# Patient Record
Sex: Male | Born: 1960 | Race: Black or African American | Hispanic: No | Marital: Married | Smoking: Never smoker
Health system: Southern US, Community
[De-identification: ages and names within clinical notes are randomized; demographics above are authoritative.]

## PROBLEM LIST (undated history)

## (undated) DIAGNOSIS — N469 Male infertility, unspecified: Secondary | ICD-10-CM

## (undated) DIAGNOSIS — R9431 Abnormal electrocardiogram [ECG] [EKG]: Secondary | ICD-10-CM

## (undated) DIAGNOSIS — D497 Neoplasm of unspecified behavior of endocrine glands and other parts of nervous system: Secondary | ICD-10-CM

## (undated) DIAGNOSIS — M79646 Pain in unspecified finger(s): Secondary | ICD-10-CM

## (undated) DIAGNOSIS — R079 Chest pain, unspecified: Secondary | ICD-10-CM

## (undated) DIAGNOSIS — G4730 Sleep apnea, unspecified: Secondary | ICD-10-CM

## (undated) DIAGNOSIS — L709 Acne, unspecified: Secondary | ICD-10-CM

## (undated) DIAGNOSIS — K760 Fatty (change of) liver, not elsewhere classified: Secondary | ICD-10-CM

## (undated) DIAGNOSIS — N62 Hypertrophy of breast: Secondary | ICD-10-CM

## (undated) DIAGNOSIS — E221 Hyperprolactinemia: Secondary | ICD-10-CM

## (undated) DIAGNOSIS — N400 Enlarged prostate without lower urinary tract symptoms: Secondary | ICD-10-CM

## (undated) HISTORY — DX: Abnormal electrocardiogram (ECG) (EKG): R94.31

## (undated) HISTORY — PX: ACHILLES TENDON REPAIR: SUR1153

## (undated) HISTORY — DX: Chest pain, unspecified: R07.9

## (undated) HISTORY — DX: Benign prostatic hyperplasia without lower urinary tract symptoms: N40.0

## (undated) HISTORY — DX: Pain in unspecified finger(s): M79.646

## (undated) HISTORY — DX: Hypertrophy of breast: N62

## (undated) HISTORY — DX: Male infertility, unspecified: N46.9

## (undated) HISTORY — DX: Fatty (change of) liver, not elsewhere classified: K76.0

## (undated) HISTORY — DX: Acne, unspecified: L70.9

## (undated) HISTORY — DX: Hyperprolactinemia: E22.1

## (undated) HISTORY — DX: Neoplasm of unspecified behavior of endocrine glands and other parts of nervous system: D49.7

## (undated) HISTORY — DX: Morbid (severe) obesity due to excess calories: E66.01

---

## 1997-07-11 ENCOUNTER — Encounter: Admission: RE | Admit: 1997-07-11 | Discharge: 1997-10-09 | Payer: Self-pay | Admitting: Radiation Oncology

## 1998-07-19 ENCOUNTER — Encounter: Payer: Self-pay | Admitting: Emergency Medicine

## 1998-07-19 ENCOUNTER — Emergency Department (HOSPITAL_COMMUNITY): Admission: EM | Admit: 1998-07-19 | Discharge: 1998-07-19 | Payer: Self-pay | Admitting: Emergency Medicine

## 2001-06-23 ENCOUNTER — Encounter: Payer: Self-pay | Admitting: Internal Medicine

## 2001-06-23 ENCOUNTER — Ambulatory Visit (HOSPITAL_COMMUNITY): Admission: RE | Admit: 2001-06-23 | Discharge: 2001-06-23 | Payer: Self-pay | Admitting: Internal Medicine

## 2002-11-03 ENCOUNTER — Encounter: Payer: Self-pay | Admitting: Internal Medicine

## 2002-11-03 ENCOUNTER — Ambulatory Visit (HOSPITAL_COMMUNITY): Admission: RE | Admit: 2002-11-03 | Discharge: 2002-11-03 | Payer: Self-pay | Admitting: Internal Medicine

## 2011-02-11 ENCOUNTER — Other Ambulatory Visit: Payer: Self-pay | Admitting: Internal Medicine

## 2011-02-11 DIAGNOSIS — D353 Benign neoplasm of craniopharyngeal duct: Secondary | ICD-10-CM

## 2011-02-15 ENCOUNTER — Ambulatory Visit
Admission: RE | Admit: 2011-02-15 | Discharge: 2011-02-15 | Disposition: A | Discharge status: Home / Self Care | Payer: PRIVATE HEALTH INSURANCE | Source: Ambulatory Visit | Attending: Internal Medicine | Admitting: Internal Medicine

## 2011-02-15 DIAGNOSIS — D352 Benign neoplasm of pituitary gland: Secondary | ICD-10-CM

## 2011-02-15 MED ORDER — GADOBENATE DIMEGLUMINE 529 MG/ML IV SOLN
10.0000 mL | Freq: Once | INTRAVENOUS | Status: AC | PRN
  Administered 2011-02-15: 10 mL via INTRAVENOUS

## 2014-03-11 ENCOUNTER — Ambulatory Visit
Admission: RE | Admit: 2014-03-11 | Discharge: 2014-03-11 | Disposition: A | Discharge status: Home / Self Care | Payer: 59 | Source: Ambulatory Visit | Attending: Nurse Practitioner | Admitting: Nurse Practitioner

## 2014-03-11 ENCOUNTER — Other Ambulatory Visit: Payer: Self-pay | Admitting: Nurse Practitioner

## 2014-03-11 DIAGNOSIS — J069 Acute upper respiratory infection, unspecified: Secondary | ICD-10-CM

## 2014-11-16 ENCOUNTER — Emergency Department (HOSPITAL_COMMUNITY)
Admission: EM | Admit: 2014-11-16 | Discharge: 2014-11-17 | Disposition: A | Discharge status: Home / Self Care | Payer: 59 | Attending: Emergency Medicine | Admitting: Emergency Medicine

## 2014-11-16 ENCOUNTER — Encounter (HOSPITAL_COMMUNITY): Payer: Self-pay

## 2014-11-16 DIAGNOSIS — R1011 Right upper quadrant pain: Secondary | ICD-10-CM | POA: Diagnosis present

## 2014-11-16 DIAGNOSIS — K529 Noninfective gastroenteritis and colitis, unspecified: Secondary | ICD-10-CM | POA: Diagnosis not present

## 2014-11-16 DIAGNOSIS — Z86018 Personal history of other benign neoplasm: Secondary | ICD-10-CM | POA: Insufficient documentation

## 2014-11-16 HISTORY — DX: Neoplasm of unspecified behavior of endocrine glands and other parts of nervous system: D49.7

## 2014-11-16 LAB — CBC
HCT: 40.4 % (ref 39.0–52.0)
Hemoglobin: 13.5 g/dL (ref 13.0–17.0)
MCH: 29 pg (ref 26.0–34.0)
MCHC: 33.4 g/dL (ref 30.0–36.0)
MCV: 86.7 fL (ref 78.0–100.0)
PLATELETS: 236 10*3/uL (ref 150–400)
RBC: 4.66 MIL/uL (ref 4.22–5.81)
RDW: 12.7 % (ref 11.5–15.5)
WBC: 7.3 10*3/uL (ref 4.0–10.5)

## 2014-11-16 LAB — URINALYSIS, ROUTINE W REFLEX MICROSCOPIC
Bilirubin Urine: NEGATIVE
Glucose, UA: NEGATIVE mg/dL
Hgb urine dipstick: NEGATIVE
Ketones, ur: NEGATIVE mg/dL
Leukocytes, UA: NEGATIVE
Nitrite: NEGATIVE
Protein, ur: NEGATIVE mg/dL
Specific Gravity, Urine: 1.018 (ref 1.005–1.030)
Urobilinogen, UA: 0.2 mg/dL (ref 0.0–1.0)
pH: 6 (ref 5.0–8.0)

## 2014-11-16 LAB — COMPREHENSIVE METABOLIC PANEL
ALT: 21 U/L (ref 17–63)
AST: 29 U/L (ref 15–41)
Albumin: 3.8 g/dL (ref 3.5–5.0)
Alkaline Phosphatase: 51 U/L (ref 38–126)
Anion gap: 7 (ref 5–15)
BILIRUBIN TOTAL: 0.5 mg/dL (ref 0.3–1.2)
BUN: 8 mg/dL (ref 6–20)
CO2: 29 mmol/L (ref 22–32)
Calcium: 9.5 mg/dL (ref 8.9–10.3)
Chloride: 101 mmol/L (ref 101–111)
Creatinine, Ser: 1.05 mg/dL (ref 0.61–1.24)
GFR calc Af Amer: >60 mL/min (ref >60)
Glucose, Bld: 113 mg/dL — ABNORMAL HIGH (ref 65–99)
Potassium: 4.2 mmol/L (ref 3.5–5.1)
Sodium: 137 mmol/L (ref 135–145)
Total Protein: 7.5 g/dL (ref 6.5–8.1)

## 2014-11-16 LAB — LIPASE, BLOOD: LIPASE: 12 U/L — AB (ref 22–51)

## 2014-11-16 NOTE — ED Notes (Signed)
Pt reports onset yesterday ate piece of chicken from convenience store.  Wife immediately had diarrhea.  Pt started having abd pain several hours after eating chicken.  Diarrhea started @ 12noon x 3, watery stools.  Pt drank 1/2 bottle of castor oil this morning @ 9am for abd pain.  Pt still having constant abd pain.

## 2014-11-17 ENCOUNTER — Emergency Department (HOSPITAL_COMMUNITY): Payer: 59

## 2014-11-17 MED ORDER — SODIUM CHLORIDE 0.9 % IV BOLUS (SEPSIS)
1000.0000 mL | Freq: Once | INTRAVENOUS | Status: AC
  Administered 2014-11-17: 1000 mL via INTRAVENOUS

## 2014-11-17 MED ORDER — ONDANSETRON HCL 4 MG/2ML IJ SOLN
4.0000 mg | Freq: Once | INTRAMUSCULAR | Status: AC
  Administered 2014-11-17: 4 mg via INTRAVENOUS
  Filled 2014-11-17: qty 2

## 2014-11-17 MED ORDER — HYDROCODONE-ACETAMINOPHEN 5-325 MG PO TABS: 1.0000 | ORAL_TABLET | Freq: Four times a day (QID) | ORAL | Status: DC | PRN

## 2014-11-17 MED ORDER — KETOROLAC TROMETHAMINE 30 MG/ML IJ SOLN
30.0000 mg | Freq: Once | INTRAMUSCULAR | Status: AC
  Administered 2014-11-17: 30 mg via INTRAVENOUS
  Filled 2014-11-17: qty 1

## 2014-11-17 NOTE — ED Provider Notes (Signed)
CSN: [id number]     Arrival date & time 11/16/14  2237 History  This chart was scribed for Veryl Speak, MD by Randa Evens, ED Scribe. This patient was seen in room A10C/A10C and the patient's care was started at 12:03 AM.      Chief Complaint  Patient presents with  . Abdominal Pain   Patient is a 54 y.o. male presenting with abdominal pain. The history is provided by the patient. No language interpreter was used.  Abdominal Pain Pain radiates to:  Does not radiate Pain severity:  Moderate Duration:  2 days Timing:  Constant Progression:  Unchanged Chronicity:  New Context: suspicious food intake   Relieved by:  Nothing Associated symptoms: diarrhea   Associated symptoms: no nausea and no vomiting   Risk factors: has not had multiple surgeries    HPI Comments: Wesley Little is a 54 y.o. male who presents to the Emergency Department complaining of constant upper abdominal pain onset 1 day prior. Pt reports eating some chicken yesterday at 4 PM that didn't agree with his stomach. Pt reports diarrhea. Pt states that his wife ate the same thing and she has had diarrhea as well. Pt states that that he has tried drinking castor oil with no relief. Pt denies nausea or vomiting.   Past Medical History  Diagnosis Date  . Pituitary tumor    Past Surgical History  Procedure Laterality Date  . Achilles tendon repair     History reviewed. No pertinent family history. History  Substance Use Topics  . Smoking status: Never Smoker   . Smokeless tobacco: Not on file  . Alcohol Use: No    Review of Systems  Gastrointestinal: Positive for abdominal pain and diarrhea. Negative for nausea and vomiting.  All other systems reviewed and are negative.    Allergies  Review of patient's allergies indicates no known allergies.  Home Medications   Prior to Admission medications   Not on File   BP 140/83 mmHg  Pulse 56  Temp(Src) 97.8 F (36.6 C) (Oral)  Resp 16  Ht 5\' 8"  (1.727 m)   Wt 315 lb (142.883 kg)  BMI 47.91 kg/m2  SpO2 100%   Physical Exam  Constitutional: He is oriented to person, place, and time. He appears well-developed and well-nourished. No distress.  HENT:  Head: Normocephalic and atraumatic.  Eyes: Conjunctivae and EOM are normal.  Neck: Neck supple. No tracheal deviation present.  Cardiovascular: Normal rate.   Pulmonary/Chest: Effort normal. No respiratory distress.  Abdominal: There is tenderness. There is no rebound and no guarding.  TTP in RUQ and epigastric region.   Musculoskeletal: Normal range of motion.  Neurological: He is alert and oriented to person, place, and time.  Skin: Skin is warm and dry.  Psychiatric: He has a normal mood and affect. His behavior is normal.  Nursing note and vitals reviewed.   ED Course  Procedures (including critical care time) DIAGNOSTIC STUDIES: Oxygen Saturation is 92% on RA, adequate by my interpretation.    COORDINATION OF CARE: 12:16 AM-Discussed treatment plan with pt at bedside and pt agreed to plan.     Labs Review Labs Reviewed  LIPASE, BLOOD - Abnormal; Notable for the following:    Lipase 12 (*)    All other components within normal limits  COMPREHENSIVE METABOLIC PANEL - Abnormal; Notable for the following:    Glucose, Bld 113 (*)    All other components within normal limits  CBC  URINALYSIS, ROUTINE W REFLEX  MICROSCOPIC (NOT AT [facility])    Imaging Review US Abdomen Limited Ruq  11/17/2014   CLINICAL DATA:  Acute onset of right upper quadrant abdominal pain for 48 hours. Initial encounter.  EXAM: US ABDOMEN LIMITED - RIGHT UPPER QUADRANT  COMPARISON:  None.  FINDINGS: Gallbladder:  There is suggestion of a few small stones dependently within the gallbladder. The gallbladder wall is borderline normal in thickness. No significant pericholecystic fluid is seen. Slight tenderness is noted overlying the gallbladder, though no definite ultrasonographic Murphy's sign is elicited. This is  equivocal as the patient is on pain medication.  Common bile duct:  Diameter: 0.4 cm, within normal limits in caliber.  Liver:  No focal lesion identified. Within normal limits in parenchymal echogenicity.  IMPRESSION: Suggestion of mild cholelithiasis. Slight tenderness overlying the gallbladder, but the gallbladder is otherwise grossly unremarkable. No definite evidence for obstruction or cholecystitis. Evaluation for ultrasonographic Murphy's sign is limited as the patient is on pain medication.   Electronically Signed   By: Garald Balding M.D.   On: 11/17/2014 02:04     EKG Interpretation None      MDM   Final diagnoses:  Abdominal pain, RUQ      Patient presents with abdominal pain and diarrhea that started shortly after eating a piece of chicken at a convenience store yesterday. He reports upper abdominal cramping. His diarrhea has been nonbloody. His workup today reveals unremarkable laboratory studies and ultrasound which does not show evidence for acute cholecystitis or other emergent pathology. He is feeling better after fluids and medications in the ER and I believe is appropriate for discharge.   I personally performed the services described in this documentation, which was scribed in my presence. The recorded information has been reviewed and is accurate.      Veryl Speak, MD 11/17/14 [phone]

## 2014-11-17 NOTE — Discharge Instructions (Signed)
Clear liquid diet for the next several days.  Hydrocodone as prescribed as needed for pain.  Return to the emergency department if you develop worsening pain, high fever, bloody stool, or other new and concerning symptoms.   Abdominal Pain Many things can cause abdominal pain. Usually, abdominal pain is not caused by a disease and will improve without treatment. It can often be observed and treated at home. Your health care provider will do a physical exam and possibly order blood tests and X-rays to help determine the seriousness of your pain. However, in many cases, more time must pass before a clear cause of the pain can be found. Before that point, your health care provider may not know if you need more testing or further treatment. HOME CARE INSTRUCTIONS  Monitor your abdominal pain for any changes. The following actions may help to alleviate any discomfort you are experiencing:  Only take over-the-counter or prescription medicines as directed by your health care provider.  Do not take laxatives unless directed to do so by your health care provider.  Try a clear liquid diet (broth, tea, or water) as directed by your health care provider. Slowly move to a bland diet as tolerated. SEEK MEDICAL CARE IF:  You have unexplained abdominal pain.  You have abdominal pain associated with nausea or diarrhea.  You have pain when you urinate or have a bowel movement.  You experience abdominal pain that wakes you in the night.  You have abdominal pain that is worsened or improved by eating food.  You have abdominal pain that is worsened with eating fatty foods.  You have a fever. SEEK IMMEDIATE MEDICAL CARE IF:   Your pain does not go away within 2 hours.  You keep throwing up (vomiting).  Your pain is felt only in portions of the abdomen, such as the right side or the left lower portion of the abdomen.  You pass bloody or black tarry stools. MAKE SURE YOU:  Understand these  instructions.   Will watch your condition.   Will get help right away if you are not doing well or get worse.  Document Released: 01/05/2005 Document Revised: 04/02/2013 Document Reviewed: 12/05/2012 [facility] Patient Information 2015 [place], [place]. This information is not intended to replace advice given to you by your health care provider. Make sure you discuss any questions you have with your health care provider.  Viral Gastroenteritis Viral gastroenteritis is also known as stomach flu. This condition affects the stomach and intestinal tract. It can cause sudden diarrhea and vomiting. The illness typically lasts 3 to 8 days. Most people develop an immune response that eventually gets rid of the virus. While this natural response develops, the virus can make you quite ill. CAUSES  Many different viruses can cause gastroenteritis, such as rotavirus or noroviruses. You can catch one of these viruses by consuming contaminated food or water. You may also catch a virus by sharing utensils or other personal items with an infected person or by touching a contaminated surface. SYMPTOMS  The most common symptoms are diarrhea and vomiting. These problems can cause a severe loss of body fluids (dehydration) and a body salt (electrolyte) imbalance. Other symptoms may include:  Fever.  Headache.  Fatigue.  Abdominal pain. DIAGNOSIS  Your caregiver can usually diagnose viral gastroenteritis based on your symptoms and a physical exam. A stool sample may also be taken to test for the presence of viruses or other infections. TREATMENT  This illness typically goes away on its  own. Treatments are aimed at rehydration. The most serious cases of viral gastroenteritis involve vomiting so severely that you are not able to keep fluids down. In these cases, fluids must be given through an intravenous line (IV). HOME CARE INSTRUCTIONS   Drink enough fluids to keep your urine clear or pale yellow. Drink  small amounts of fluids frequently and increase the amounts as tolerated.  Ask your caregiver for specific rehydration instructions.  Avoid:  Foods high in sugar.  Alcohol.  Carbonated drinks.  Tobacco.  Juice.  Caffeine drinks.  Extremely hot or cold fluids.  Fatty, greasy foods.  Too much intake of anything at one time.  Dairy products until 24 to 48 hours after diarrhea stops.  You may consume probiotics. Probiotics are active cultures of beneficial bacteria. They may lessen the amount and number of diarrheal stools in adults. Probiotics can be found in yogurt with active cultures and in supplements.  Wash your hands well to avoid spreading the virus.  Only take over-the-counter or prescription medicines for pain, discomfort, or fever as directed by your caregiver. Do not give aspirin to children. Antidiarrheal medicines are not recommended.  Ask your caregiver if you should continue to take your regular prescribed and over-the-counter medicines.  Keep all follow-up appointments as directed by your caregiver. SEEK IMMEDIATE MEDICAL CARE IF:   You are unable to keep fluids down.  You do not urinate at least once every 6 to 8 hours.  You develop shortness of breath.  You notice blood in your stool or vomit. This may look like coffee grounds.  You have abdominal pain that increases or is concentrated in one small area (localized).  You have persistent vomiting or diarrhea.  You have a fever.  The patient is a child younger than 3 months, and he or she has a fever.  The patient is a child older than 3 months, and he or she has a fever and persistent symptoms.  The patient is a child older than 3 months, and he or she has a fever and symptoms suddenly get worse.  The patient is a baby, and he or she has no tears when crying. MAKE SURE YOU:   Understand these instructions.  Will watch your condition.  Will get help right away if you are not doing well or  get worse. Document Released: 03/28/2005 Document Revised: 06/20/2011 Document Reviewed: 01/12/2011 [facility] Patient Information 2015 [place], [place]. This information is not intended to replace advice given to you by your health care provider. Make sure you discuss any questions you have with your health care provider.

## 2014-11-19 ENCOUNTER — Emergency Department (HOSPITAL_COMMUNITY): Payer: 59

## 2014-11-19 ENCOUNTER — Encounter (HOSPITAL_COMMUNITY): Payer: Self-pay | Admitting: *Deleted

## 2014-11-19 ENCOUNTER — Inpatient Hospital Stay (HOSPITAL_COMMUNITY)
Admission: EM | Admit: 2014-11-19 | Discharge: 2014-11-23 | DRG: 418 | Disposition: A | Discharge status: Home / Self Care | Payer: 59 | Attending: Surgery | Admitting: Surgery

## 2014-11-19 DIAGNOSIS — Z79899 Other long term (current) drug therapy: Secondary | ICD-10-CM

## 2014-11-19 DIAGNOSIS — K660 Peritoneal adhesions (postprocedural) (postinfection): Secondary | ICD-10-CM | POA: Diagnosis present

## 2014-11-19 DIAGNOSIS — Z6841 Body Mass Index (BMI) 40.0 and over, adult: Secondary | ICD-10-CM

## 2014-11-19 DIAGNOSIS — K810 Acute cholecystitis: Secondary | ICD-10-CM | POA: Diagnosis not present

## 2014-11-19 DIAGNOSIS — R109 Unspecified abdominal pain: Secondary | ICD-10-CM | POA: Diagnosis not present

## 2014-11-19 DIAGNOSIS — Z419 Encounter for procedure for purposes other than remedying health state, unspecified: Secondary | ICD-10-CM

## 2014-11-19 HISTORY — DX: Sleep apnea, unspecified: G47.30

## 2014-11-19 LAB — URINALYSIS, ROUTINE W REFLEX MICROSCOPIC
BILIRUBIN URINE: NEGATIVE
Glucose, UA: NEGATIVE mg/dL
HGB URINE DIPSTICK: NEGATIVE
KETONES UR: NEGATIVE mg/dL
Leukocytes, UA: NEGATIVE
Nitrite: NEGATIVE
PH: 6.5 (ref 5.0–8.0)
PROTEIN: NEGATIVE mg/dL
Specific Gravity, Urine: 1.042 — ABNORMAL HIGH (ref 1.005–1.030)
UROBILINOGEN UA: 1 mg/dL (ref 0.0–1.0)

## 2014-11-19 LAB — COMPREHENSIVE METABOLIC PANEL
ALBUMIN: 3.5 g/dL (ref 3.5–5.0)
ALT: 33 U/L (ref 17–63)
AST: 47 U/L — AB (ref 15–41)
Alkaline Phosphatase: 63 U/L (ref 38–126)
Anion gap: 9 (ref 5–15)
BUN: 5 mg/dL — ABNORMAL LOW (ref 6–20)
CO2: 29 mmol/L (ref 22–32)
CREATININE: 0.98 mg/dL (ref 0.61–1.24)
Calcium: 9.1 mg/dL (ref 8.9–10.3)
Chloride: 99 mmol/L — ABNORMAL LOW (ref 101–111)
GFR calc Af Amer: >60 mL/min (ref >60)
Glucose, Bld: 105 mg/dL — ABNORMAL HIGH (ref 65–99)
Potassium: 3.9 mmol/L (ref 3.5–5.1)
Sodium: 137 mmol/L (ref 135–145)
Total Bilirubin: 1 mg/dL (ref 0.3–1.2)
Total Protein: 7.8 g/dL (ref 6.5–8.1)

## 2014-11-19 LAB — CBC
HEMATOCRIT: 41.1 % (ref 39.0–52.0)
Hemoglobin: 13.8 g/dL (ref 13.0–17.0)
MCH: 28.9 pg (ref 26.0–34.0)
MCHC: 33.6 g/dL (ref 30.0–36.0)
MCV: 86 fL (ref 78.0–100.0)
Platelets: 234 10*3/uL (ref 150–400)
RBC: 4.78 MIL/uL (ref 4.22–5.81)
RDW: 12.6 % (ref 11.5–15.5)
WBC: 8.9 10*3/uL (ref 4.0–10.5)

## 2014-11-19 LAB — LIPASE, BLOOD: Lipase: 15 U/L — ABNORMAL LOW (ref 22–51)

## 2014-11-19 MED ORDER — ENOXAPARIN SODIUM 40 MG/0.4ML ~~LOC~~ SOLN: 40.0000 mg | SUBCUTANEOUS | Status: DC

## 2014-11-19 MED ORDER — KCL IN DEXTROSE-NACL 20-5-0.45 MEQ/L-%-% IV SOLN
INTRAVENOUS | Status: DC
  Administered 2014-11-20 – 2014-11-21 (×4): via INTRAVENOUS
  Filled 2014-11-19 (×5): qty 1000

## 2014-11-19 MED ORDER — CABERGOLINE 0.5 MG PO TABS
0.2500 mg | ORAL_TABLET | ORAL | Status: DC
  Filled 2014-11-19: qty 1

## 2014-11-19 MED ORDER — HYDROMORPHONE HCL 1 MG/ML IJ SOLN
1.0000 mg | INTRAMUSCULAR | Status: DC | PRN
  Administered 2014-11-20 (×2): 1 mg via INTRAVENOUS
  Administered 2014-11-20: 2 mg via INTRAVENOUS
  Administered 2014-11-21 (×2): 1 mg via INTRAVENOUS
  Filled 2014-11-19 (×4): qty 1
  Filled 2014-11-19: qty 2

## 2014-11-19 MED ORDER — ACETAMINOPHEN 325 MG PO TABS: 650.0000 mg | ORAL_TABLET | Freq: Four times a day (QID) | ORAL | Status: DC | PRN

## 2014-11-19 MED ORDER — ONDANSETRON HCL 4 MG/2ML IJ SOLN
4.0000 mg | Freq: Four times a day (QID) | INTRAMUSCULAR | Status: DC | PRN
  Administered 2014-11-20: 4 mg via INTRAVENOUS
  Administered 2014-11-20: 8 mg via INTRAVENOUS

## 2014-11-19 MED ORDER — PIPERACILLIN-TAZOBACTAM 3.375 G IVPB 30 MIN
3.3750 g | Freq: Once | INTRAVENOUS | Status: AC
  Administered 2014-11-20: 3.375 g via INTRAVENOUS
  Filled 2014-11-19: qty 50

## 2014-11-19 MED ORDER — MORPHINE SULFATE 4 MG/ML IJ SOLN
8.0000 mg | Freq: Once | INTRAMUSCULAR | Status: AC
  Administered 2014-11-19: 8 mg via INTRAVENOUS
  Filled 2014-11-19: qty 2

## 2014-11-19 MED ORDER — ACETAMINOPHEN 650 MG RE SUPP: 650.0000 mg | Freq: Four times a day (QID) | RECTAL | Status: DC | PRN

## 2014-11-19 MED ORDER — ONDANSETRON 4 MG PO TBDP
4.0000 mg | ORAL_TABLET | Freq: Four times a day (QID) | ORAL | Status: DC | PRN
  Filled 2014-11-19: qty 1

## 2014-11-19 MED ORDER — PANTOPRAZOLE SODIUM 40 MG IV SOLR
40.0000 mg | Freq: Every day | INTRAVENOUS | Status: DC
  Administered 2014-11-20 – 2014-11-22 (×4): 40 mg via INTRAVENOUS
  Filled 2014-11-19 (×4): qty 40

## 2014-11-19 NOTE — H&P (Signed)
Wesley Little is an 54 y.o. male.   Chief Complaint: Abdominal pain HPI: Started several days ago, had [place] two days ago suggesting gallstones, no Murphy's sign or acute findings.  Today, examination changed.  Acute GB on ultrasound today.  No other studies necessary  Past Medical History  Diagnosis Date  . Pituitary tumor     Past Surgical History  Procedure Laterality Date  . Achilles tendon repair      No family history on file. Social History:  reports that he has never smoked. He does not have any smokeless tobacco history on file. He reports that he does not drink alcohol or use illicit drugs.  Allergies: No Known Allergies  Medications Prior to Admission  Medication Sig Dispense Refill  . acetaminophen (TYLENOL) 500 MG tablet Take 1,000 mg by mouth every 6 (six) hours as needed for mild pain or moderate pain.    . cabergoline (DOSTINEX) 0.5 MG tablet Take 0.25 mg by mouth 2 (two) times a week.     . calcium carbonate (TUMS - DOSED IN MG ELEMENTAL CALCIUM) 500 MG chewable tablet Chew 1 tablet by mouth daily as needed for indigestion or heartburn.    . Multiple Vitamins-Minerals (MULTIVITAMIN & MINERAL PO) Take 1 tablet by mouth daily.    . peppermint oil liquid Apply 1 application topically as needed (body pain).    . Simethicone (GAS-X PO) Take 1 tablet by mouth daily as needed (flatuelence).    Marland Kitchen HYDROcodone-acetaminophen (NORCO) 5-325 MG per tablet Take 1-2 tablets by mouth every 6 (six) hours as needed. (Patient not taking: Reported on 11/19/2014) 6 tablet 0    Results for orders placed or performed during the hospital encounter of 11/19/14 (from the past 48 hour(s))  Lipase, blood     Status: Abnormal   Collection Time: 11/19/14  3:17 PM  Result Value Ref Range   Lipase 15 (L) 22 - 51 U/L  Comprehensive metabolic panel     Status: Abnormal   Collection Time: 11/19/14  3:17 PM  Result Value Ref Range   Sodium 137 135 - 145 mmol/L   Potassium 3.9 3.5 - 5.1 mmol/L   Chloride 99 (L) 101 - 111 mmol/L   CO2 29 22 - 32 mmol/L   Glucose, Bld 105 (H) 65 - 99 mg/dL   BUN 5 (L) 6 - 20 mg/dL   Creatinine, Ser 4.26 0.61 - 1.24 mg/dL   Calcium 9.1 8.9 - 64.0 mg/dL   Total Protein 7.8 6.5 - 8.1 g/dL   Albumin 3.5 3.5 - 5.0 g/dL   AST 47 (H) 15 - 41 U/L   ALT 33 17 - 63 U/L   Alkaline Phosphatase 63 38 - 126 U/L   Total Bilirubin 1.0 0.3 - 1.2 mg/dL   GFR calc non Af Amer >60 >60 mL/min   GFR calc Af Amer >60 >60 mL/min    Comment: (NOTE) The eGFR has been calculated using the CKD EPI equation. This calculation has not been validated in all clinical situations. eGFR's persistently <60 mL/min signify possible Chronic Kidney Disease.    Anion gap 9 5 - 15  CBC     Status: None   Collection Time: 11/19/14  3:17 PM  Result Value Ref Range   WBC 8.9 4.0 - 10.5 K/uL   RBC 4.78 4.22 - 5.81 MIL/uL   Hemoglobin 13.8 13.0 - 17.0 g/dL   HCT 59.9 54.2 - 82.2 %   MCV 86.0 78.0 - 100.0 fL  MCH 28.9 26.0 - 34.0 pg   MCHC 33.6 30.0 - 36.0 g/dL   RDW 12.6 11.5 - 15.5 %   Platelets 234 150 - 400 K/uL  Urinalysis, Routine w reflex microscopic (not at [facility])     Status: Abnormal   Collection Time: 11/19/14  4:33 PM  Result Value Ref Range   Color, Urine YELLOW YELLOW   APPearance CLEAR CLEAR   Specific Gravity, Urine 1.042 (H) 1.005 - 1.030   pH 6.5 5.0 - 8.0   Glucose, UA NEGATIVE NEGATIVE mg/dL   Hgb urine dipstick NEGATIVE NEGATIVE   Bilirubin Urine NEGATIVE NEGATIVE   Ketones, ur NEGATIVE NEGATIVE mg/dL   Protein, ur NEGATIVE NEGATIVE mg/dL   Urobilinogen, UA 1.0 0.0 - 1.0 mg/dL   Nitrite NEGATIVE NEGATIVE   Leukocytes, UA NEGATIVE NEGATIVE    Comment: MICROSCOPIC NOT DONE ON URINES WITH NEGATIVE PROTEIN, BLOOD, LEUKOCYTES, NITRITE, OR GLUCOSE <1000 mg/dL.   US Abdomen Limited  11/19/2014   CLINICAL DATA:  Right upper quadrant pain  EXAM: US ABDOMEN LIMITED - RIGHT UPPER QUADRANT  COMPARISON:  November 17, 2014  FINDINGS: Gallbladder:  There is an echogenic  focus adherent in the neck of the gallbladder measuring 1.7 cm in length. This echogenic focus shadows and is felt to represent an adherent gallstone. The gallbladder wall is thickened and mildly edematous. There is focal tenderness over the gallbladder. There is no pericholecystic fluid.  Common bile duct:  Diameter: 5 mm. There is no intrahepatic or extrahepatic biliary duct dilatation.  Liver:  No focal lesion identified. Within normal limits in parenchymal echogenicity.  IMPRESSION: Evidence of a calculus adherent in the neck of the gallbladder with gallbladder wall thickening and positive Murphy's sign. These are findings indicative of acute cholecystitis.   Electronically Signed   By: Lowella Grip III M.D.   On: 11/19/2014 18:52    Review of Systems  Gastrointestinal: Positive for nausea, vomiting and abdominal pain.  All other systems reviewed and are negative.   Blood pressure 113/60, pulse 78, temperature 98.6 F (37 C), temperature source Oral, resp. rate 19, height $RemoveBe'5\' 8"'[id number]$  (1.727 m), weight 141.7 kg (312 lb 6.3 oz), SpO2 99 %. Physical Exam  Vitals reviewed. Constitutional: He is oriented to person, place, and time. He appears well-developed and well-nourished.  Morbidly obese  HENT:  Head: Normocephalic and atraumatic.  Eyes: Conjunctivae and EOM are normal. Pupils are equal, round, and reactive to light.  Neck: Normal range of motion. Neck supple.  Cardiovascular: Normal rate, regular rhythm and normal heart sounds.   Respiratory: Effort normal and breath sounds normal.  GI: Soft. Normal appearance and bowel sounds are normal. There is tenderness in the right upper quadrant and epigastric area. There is rebound, guarding and positive Murphy's sign. There is no rigidity.  Musculoskeletal: Normal range of motion.  Neurological: He is alert and oriented to person, place, and time. He has normal reflexes.  Skin: Skin is warm and dry.  Psychiatric: He has a normal mood and affect.  His behavior is normal. Judgment and thought content normal.     Assessment/Plan Acute cholecystitis and cholelithiasis.    Risks and benefits of surgery discussed with the patient and his wife (who is a Marine scientist, former [employer]), including the possibility of open procedure. Patient is on IV Zosyn.  Jayvier Burgher 11/19/2014, 11:06 PM

## 2014-11-19 NOTE — ED Provider Notes (Signed)
CSN: [id number]     Arrival date & time 11/19/14  1448 History   First MD Initiated Contact with Patient 11/19/14 1608     Chief Complaint  Patient presents with  . Abdominal Pain     (Consider location/radiation/quality/duration/timing/severity/associated sxs/prior Treatment) HPI Comments: NPO time 1:30 PM  Patient is a 54 y.o. male presenting with abdominal pain. The history is provided by the patient.  Abdominal Pain Pain location:  RUQ Pain quality: sharp   Pain radiates to:  Does not radiate Pain severity:  Moderate Onset quality:  Gradual Duration:  4 days Timing:  Constant Progression:  Worsening Chronicity:  New Context: awakening from sleep   Context: not previous surgeries   Relieved by:  Acetaminophen Worsened by:  Eating Ineffective treatments:  None tried Associated symptoms: no diarrhea and no fever   Risk factors: obesity   Risk factors: has not had multiple surgeries and no recent hospitalization     Past Medical History  Diagnosis Date  . Pituitary tumor    Past Surgical History  Procedure Laterality Date  . Achilles tendon repair     No family history on file. Social History  Substance Use Topics  . Smoking status: Never Smoker   . Smokeless tobacco: None  . Alcohol Use: No    Review of Systems  Constitutional: Negative for fever.  Gastrointestinal: Positive for abdominal pain. Negative for diarrhea.  All other systems reviewed and are negative.     Allergies  Review of patient's allergies indicates no known allergies.  Home Medications   Prior to Admission medications   Medication Sig Start Date End Date Taking? Authorizing Provider  HYDROcodone-acetaminophen (NORCO) 5-325 MG per tablet Take 1-2 tablets by mouth every 6 (six) hours as needed. 11/17/14   Veryl Speak, MD   BP 124/79 mmHg  Pulse 77  Temp(Src) 98.8 F (37.1 C) (Oral)  Resp 18  SpO2 94% Physical Exam  Constitutional: He is oriented to person, place, and time. He  appears well-developed and well-nourished. No distress.  HENT:  Head: Normocephalic and atraumatic.  Eyes: Conjunctivae are normal.  Neck: Neck supple. No tracheal deviation present.  Cardiovascular: Normal rate and regular rhythm.   Pulmonary/Chest: Effort normal. No respiratory distress.  Abdominal: Soft. He exhibits no distension. There is tenderness in the right upper quadrant. There is guarding and positive Murphy's sign.  Neurological: He is alert and oriented to person, place, and time.  Skin: Skin is warm and dry.  Psychiatric: He has a normal mood and affect.    ED Course  Procedures (including critical care time) Labs Review Labs Reviewed  LIPASE, BLOOD - Abnormal; Notable for the following:    Lipase 15 (*)    All other components within normal limits  COMPREHENSIVE METABOLIC PANEL - Abnormal; Notable for the following:    Chloride 99 (*)    Glucose, Bld 105 (*)    BUN 5 (*)    AST 47 (*)    All other components within normal limits  CBC  URINALYSIS, ROUTINE W REFLEX MICROSCOPIC (NOT AT [facility])    Imaging Review US Abdomen Limited  11/19/2014   CLINICAL DATA:  Right upper quadrant pain  EXAM: US ABDOMEN LIMITED - RIGHT UPPER QUADRANT  COMPARISON:  November 17, 2014  FINDINGS: Gallbladder:  There is an echogenic focus adherent in the neck of the gallbladder measuring 1.7 cm in length. This echogenic focus shadows and is felt to represent an adherent gallstone. The gallbladder wall is thickened and  mildly edematous. There is focal tenderness over the gallbladder. There is no pericholecystic fluid.  Common bile duct:  Diameter: 5 mm. There is no intrahepatic or extrahepatic biliary duct dilatation.  Liver:  No focal lesion identified. Within normal limits in parenchymal echogenicity.  IMPRESSION: Evidence of a calculus adherent in the neck of the gallbladder with gallbladder wall thickening and positive Murphy's sign. These are findings indicative of acute cholecystitis.    Electronically Signed   By: Lowella Grip III M.D.   On: 11/19/2014 18:52   I independently viewed and interpreted the above radiology studies and agree with radiologist report.    EKG Interpretation None      MDM   Final diagnoses:  Acute cholecystitis   54 year old male presents with ongoing right upper quadrant pain after having CT scan performed earlier today in follow-up for pain in clinic. CT scan shows findings consistent with acute cholecystitis including gallbladder wall thickening. Ultrasound repeated here shows recurrent evidence of wall thickening and a stone at the neck of the gallbladder. He is afebrile, not currently vomiting, does not have leukocytosis or other signs of infection or injury and damage. Gen. surgery was consulted for evaluation of the patient and the recommended admission to the hospital for cholecystectomy in the morning and prophylactic Zosyn.    Leo Grosser, MD 11/19/14 2329

## 2014-11-19 NOTE — ED Notes (Addendum)
Pt states that he was seen in the ED for abd pain  11/16/14 and dc'd home. States that the pain continued and so he saw his PCP to sent him for a CT scan which showed acute cholecystitis or possible acalculous colitis. States that his PCP sent him to ED for surgery prep.

## 2014-11-20 ENCOUNTER — Observation Stay (HOSPITAL_COMMUNITY): Payer: 59

## 2014-11-20 ENCOUNTER — Encounter (HOSPITAL_COMMUNITY): Admission: EM | Disposition: A | Discharge status: Home / Self Care | Payer: Self-pay | Source: Home / Self Care

## 2014-11-20 ENCOUNTER — Encounter (HOSPITAL_COMMUNITY): Payer: Self-pay | Admitting: General Practice

## 2014-11-20 ENCOUNTER — Observation Stay (HOSPITAL_COMMUNITY): Payer: 59 | Admitting: Anesthesiology

## 2014-11-20 HISTORY — PX: CHOLECYSTECTOMY: SHX55

## 2014-11-20 LAB — COMPREHENSIVE METABOLIC PANEL
ALT: 38 U/L (ref 17–63)
ANION GAP: 9 (ref 5–15)
AST: 39 U/L (ref 15–41)
Albumin: 3.1 g/dL — ABNORMAL LOW (ref 3.5–5.0)
Alkaline Phosphatase: 75 U/L (ref 38–126)
BILIRUBIN TOTAL: 1.1 mg/dL (ref 0.3–1.2)
CO2: 30 mmol/L (ref 22–32)
Calcium: 8.6 mg/dL — ABNORMAL LOW (ref 8.9–10.3)
Chloride: 98 mmol/L — ABNORMAL LOW (ref 101–111)
Creatinine, Ser: 0.97 mg/dL (ref 0.61–1.24)
GFR calc Af Amer: >60 mL/min (ref >60)
GFR calc non Af Amer: >60 mL/min (ref >60)
GLUCOSE: 112 mg/dL — AB (ref 65–99)
POTASSIUM: 3.5 mmol/L (ref 3.5–5.1)
Sodium: 137 mmol/L (ref 135–145)
Total Protein: 7 g/dL (ref 6.5–8.1)

## 2014-11-20 LAB — CBC
HCT: 40.2 % (ref 39.0–52.0)
HEMOGLOBIN: 13.3 g/dL (ref 13.0–17.0)
MCH: 28.9 pg (ref 26.0–34.0)
MCHC: 33.1 g/dL (ref 30.0–36.0)
MCV: 87.2 fL (ref 78.0–100.0)
Platelets: 242 10*3/uL (ref 150–400)
RBC: 4.61 MIL/uL (ref 4.22–5.81)
RDW: 12.5 % (ref 11.5–15.5)
WBC: 8.2 10*3/uL (ref 4.0–10.5)

## 2014-11-20 LAB — SURGICAL PCR SCREEN
MRSA, PCR: NEGATIVE
Staphylococcus aureus: NEGATIVE

## 2014-11-20 SURGERY — LAPAROSCOPIC CHOLECYSTECTOMY WITH INTRAOPERATIVE CHOLANGIOGRAM
Anesthesia: General | Site: Abdomen

## 2014-11-20 MED ORDER — HYDROMORPHONE HCL 1 MG/ML IJ SOLN
INTRAMUSCULAR | Status: AC
  Filled 2014-11-20: qty 1

## 2014-11-20 MED ORDER — LIDOCAINE HCL (CARDIAC) 20 MG/ML IV SOLN
INTRAVENOUS | Status: AC
  Filled 2014-11-20: qty 5

## 2014-11-20 MED ORDER — ROCURONIUM BROMIDE 100 MG/10ML IV SOLN
INTRAVENOUS | Status: DC | PRN
  Administered 2014-11-20: 30 mg via INTRAVENOUS
  Administered 2014-11-20: 10 mg via INTRAVENOUS

## 2014-11-20 MED ORDER — MIDAZOLAM HCL 2 MG/2ML IJ SOLN: 0.5000 mg | Freq: Once | INTRAMUSCULAR | Status: DC | PRN

## 2014-11-20 MED ORDER — SUCCINYLCHOLINE CHLORIDE 20 MG/ML IJ SOLN
INTRAMUSCULAR | Status: DC | PRN
  Administered 2014-11-20: 200 mg via INTRAVENOUS

## 2014-11-20 MED ORDER — FENTANYL CITRATE (PF) 100 MCG/2ML IJ SOLN
INTRAMUSCULAR | Status: DC | PRN
  Administered 2014-11-20: 50 ug via INTRAVENOUS
  Administered 2014-11-20: 150 ug via INTRAVENOUS
  Administered 2014-11-20: 50 ug via INTRAVENOUS

## 2014-11-20 MED ORDER — ONDANSETRON HCL 4 MG/2ML IJ SOLN
INTRAMUSCULAR | Status: AC
  Filled 2014-11-20: qty 2

## 2014-11-20 MED ORDER — NEOSTIGMINE METHYLSULFATE 10 MG/10ML IV SOLN
INTRAVENOUS | Status: DC | PRN
  Administered 2014-11-20: 3 mg via INTRAVENOUS

## 2014-11-20 MED ORDER — LACTATED RINGERS IV SOLN
INTRAVENOUS | Status: DC
  Administered 2014-11-20: 12:00:00 via INTRAVENOUS

## 2014-11-20 MED ORDER — FENTANYL CITRATE (PF) 250 MCG/5ML IJ SOLN
INTRAMUSCULAR | Status: AC
  Filled 2014-11-20: qty 5

## 2014-11-20 MED ORDER — HYDROMORPHONE HCL 1 MG/ML IJ SOLN
INTRAMUSCULAR | Status: AC
  Administered 2014-11-20: 0.25 mg via INTRAVENOUS
  Filled 2014-11-20: qty 1

## 2014-11-20 MED ORDER — LACTATED RINGERS IV SOLN
INTRAVENOUS | Status: DC | PRN
  Administered 2014-11-20: 12:00:00 via INTRAVENOUS

## 2014-11-20 MED ORDER — MIDAZOLAM HCL 2 MG/2ML IJ SOLN
INTRAMUSCULAR | Status: AC
  Filled 2014-11-20: qty 4

## 2014-11-20 MED ORDER — PHENYLEPHRINE 40 MCG/ML (10ML) SYRINGE FOR IV PUSH (FOR BLOOD PRESSURE SUPPORT)
PREFILLED_SYRINGE | INTRAVENOUS | Status: AC
  Filled 2014-11-20: qty 10

## 2014-11-20 MED ORDER — GLYCOPYRROLATE 0.2 MG/ML IJ SOLN
INTRAMUSCULAR | Status: DC | PRN
  Administered 2014-11-20: .5 mg via INTRAVENOUS

## 2014-11-20 MED ORDER — ROCURONIUM BROMIDE 50 MG/5ML IV SOLN
INTRAVENOUS | Status: AC
  Filled 2014-11-20: qty 1

## 2014-11-20 MED ORDER — PROPOFOL 10 MG/ML IV BOLUS
INTRAVENOUS | Status: DC | PRN
  Administered 2014-11-20: 200 mg via INTRAVENOUS

## 2014-11-20 MED ORDER — 0.9 % SODIUM CHLORIDE (POUR BTL) OPTIME
TOPICAL | Status: DC | PRN
  Administered 2014-11-20: 2000 mL

## 2014-11-20 MED ORDER — HYDROMORPHONE HCL 1 MG/ML IJ SOLN
0.2500 mg | INTRAMUSCULAR | Status: DC | PRN
  Administered 2014-11-20 (×4): 0.25 mg via INTRAVENOUS

## 2014-11-20 MED ORDER — PROMETHAZINE HCL 25 MG/ML IJ SOLN: 6.2500 mg | INTRAMUSCULAR | Status: DC | PRN

## 2014-11-20 MED ORDER — LIDOCAINE HCL (CARDIAC) 20 MG/ML IV SOLN
INTRAVENOUS | Status: DC | PRN
  Administered 2014-11-20: 20 mg via INTRAVENOUS

## 2014-11-20 MED ORDER — MIDAZOLAM HCL 5 MG/5ML IJ SOLN
INTRAMUSCULAR | Status: DC | PRN
  Administered 2014-11-20: 2 mg via INTRAVENOUS

## 2014-11-20 MED ORDER — DEXAMETHASONE SODIUM PHOSPHATE 4 MG/ML IJ SOLN
INTRAMUSCULAR | Status: DC | PRN
  Administered 2014-11-20: 4 mg via INTRAVENOUS

## 2014-11-20 MED ORDER — GLYCOPYRROLATE 0.2 MG/ML IJ SOLN
INTRAMUSCULAR | Status: AC
  Filled 2014-11-20: qty 2

## 2014-11-20 MED ORDER — OXYCODONE-ACETAMINOPHEN 5-325 MG PO TABS
1.0000 | ORAL_TABLET | ORAL | Status: DC | PRN
  Administered 2014-11-21 (×3): 2 via ORAL
  Filled 2014-11-20 (×2): qty 1
  Filled 2014-11-20 (×2): qty 2

## 2014-11-20 MED ORDER — BUPIVACAINE-EPINEPHRINE (PF) 0.25% -1:200000 IJ SOLN
INTRAMUSCULAR | Status: AC
  Filled 2014-11-20: qty 30

## 2014-11-20 MED ORDER — PHENYLEPHRINE HCL 10 MG/ML IJ SOLN
INTRAMUSCULAR | Status: DC | PRN
  Administered 2014-11-20 (×4): 80 ug via INTRAVENOUS

## 2014-11-20 MED ORDER — SODIUM CHLORIDE 0.9 % IV SOLN: INTRAVENOUS | Status: DC | PRN

## 2014-11-20 MED ORDER — MEPERIDINE HCL 25 MG/ML IJ SOLN: 6.2500 mg | INTRAMUSCULAR | Status: DC | PRN

## 2014-11-20 MED ORDER — SODIUM CHLORIDE 0.9 % IR SOLN
Status: DC | PRN
  Administered 2014-11-20 (×2): 1000 mL

## 2014-11-20 MED ORDER — PIPERACILLIN-TAZOBACTAM 3.375 G IVPB 30 MIN
3.3750 g | INTRAVENOUS | Status: DC
  Filled 2014-11-20: qty 50

## 2014-11-20 MED ORDER — PROPOFOL 10 MG/ML IV BOLUS
INTRAVENOUS | Status: AC
  Filled 2014-11-20: qty 20

## 2014-11-20 MED ORDER — DEXAMETHASONE SODIUM PHOSPHATE 4 MG/ML IJ SOLN
INTRAMUSCULAR | Status: AC
  Filled 2014-11-20: qty 1

## 2014-11-20 MED ORDER — BUPIVACAINE-EPINEPHRINE 0.25% -1:200000 IJ SOLN
INTRAMUSCULAR | Status: DC | PRN
  Administered 2014-11-20: 30 mL

## 2014-11-20 MED ORDER — HEMOSTATIC AGENTS (NO CHARGE) OPTIME
TOPICAL | Status: DC | PRN
  Administered 2014-11-20: 2 via TOPICAL

## 2014-11-20 SURGICAL SUPPLY — 57 items
APPLIER CLIP ROT 10 11.4 M/L (STAPLE) ×3
APR CLP MED LRG 11.4X10 (STAPLE) ×1
BAG SPEC RTRVL LRG 6X4 10 (ENDOMECHANICALS) ×1
BLADE SURG ROTATE 9660 (MISCELLANEOUS) ×2 IMPLANT
CANISTER SUCTION 2500CC (MISCELLANEOUS) ×3 IMPLANT
CHLORAPREP W/TINT 26ML (MISCELLANEOUS) ×3 IMPLANT
CLIP APPLIE ROT 10 11.4 M/L (STAPLE) ×1 IMPLANT
COVER MAYO STAND STRL (DRAPES) ×3 IMPLANT
COVER SURGICAL LIGHT HANDLE (MISCELLANEOUS) ×3 IMPLANT
CUTTER LINEAR ENDO 35 ART FLEX (STAPLE) ×2 IMPLANT
DRAIN CHANNEL 19F RND (DRAIN) ×2 IMPLANT
DRAPE C-ARM 42X72 X-RAY (DRAPES) ×3 IMPLANT
DRAPE WARM FLUID 44X44 (DRAPE) ×3 IMPLANT
ELECT REM PT RETURN 9FT ADLT (ELECTROSURGICAL) ×3
ELECTRODE REM PT RTRN 9FT ADLT (ELECTROSURGICAL) ×1 IMPLANT
EVACUATOR SILICONE 100CC (DRAIN) ×2 IMPLANT
GAUZE SPONGE 2X2 8PLY STRL LF (GAUZE/BANDAGES/DRESSINGS) IMPLANT
GLOVE BIO SURGEON STRL SZ 6.5 (GLOVE) ×1 IMPLANT
GLOVE BIO SURGEON STRL SZ7.5 (GLOVE) ×2 IMPLANT
GLOVE BIO SURGEON STRL SZ8 (GLOVE) ×3 IMPLANT
GLOVE BIO SURGEONS STRL SZ 6.5 (GLOVE) ×1
GLOVE BIOGEL PI IND STRL 7.0 (GLOVE) IMPLANT
GLOVE BIOGEL PI IND STRL 7.5 (GLOVE) IMPLANT
GLOVE BIOGEL PI IND STRL 8 (GLOVE) ×1 IMPLANT
GLOVE BIOGEL PI INDICATOR 7.0 (GLOVE) ×4
GLOVE BIOGEL PI INDICATOR 7.5 (GLOVE) ×2
GLOVE BIOGEL PI INDICATOR 8 (GLOVE) ×2
GLOVE SURG SS PI 7.0 STRL IVOR (GLOVE) ×2 IMPLANT
GOWN STRL REUS W/ TWL LRG LVL3 (GOWN DISPOSABLE) ×2 IMPLANT
GOWN STRL REUS W/ TWL XL LVL3 (GOWN DISPOSABLE) ×1 IMPLANT
GOWN STRL REUS W/TWL LRG LVL3 (GOWN DISPOSABLE) ×6
GOWN STRL REUS W/TWL XL LVL3 (GOWN DISPOSABLE) ×3
HEMOSTAT SNOW SURGICEL 2X4 (HEMOSTASIS) ×4 IMPLANT
KIT BASIN OR (CUSTOM PROCEDURE TRAY) ×3 IMPLANT
KIT ROOM TURNOVER OR (KITS) ×3 IMPLANT
LIQUID BAND (GAUZE/BANDAGES/DRESSINGS) ×9 IMPLANT
NS IRRIG 1000ML POUR BTL (IV SOLUTION) ×3 IMPLANT
PAD ARMBOARD 7.5X6 YLW CONV (MISCELLANEOUS) ×3 IMPLANT
POUCH SPECIMEN RETRIEVAL 10MM (ENDOMECHANICALS) ×3 IMPLANT
SCISSORS LAP 5X35 DISP (ENDOMECHANICALS) ×3 IMPLANT
SET CHOLANGIOGRAPH 5 50 .035 (SET/KITS/TRAYS/PACK) ×3 IMPLANT
SET IRRIG TUBING LAPAROSCOPIC (IRRIGATION / IRRIGATOR) ×3 IMPLANT
SLEEVE ENDOPATH XCEL 5M (ENDOMECHANICALS) ×3 IMPLANT
SPECIMEN JAR SMALL (MISCELLANEOUS) ×3 IMPLANT
SPONGE GAUZE 2X2 STER 10/PKG (GAUZE/BANDAGES/DRESSINGS) ×2
SUT ETHILON 2 0 FS 18 (SUTURE) ×2 IMPLANT
SUT MNCRL AB 4-0 PS2 18 (SUTURE) ×3 IMPLANT
TAPE CLOTH SURG 4X10 WHT LF (GAUZE/BANDAGES/DRESSINGS) ×2 IMPLANT
TOWEL OR 17X24 6PK STRL BLUE (TOWEL DISPOSABLE) ×3 IMPLANT
TOWEL OR 17X26 10 PK STRL BLUE (TOWEL DISPOSABLE) ×3 IMPLANT
TRAY CATH 16FR W/PLASTIC CATH (SET/KITS/TRAYS/PACK) ×2 IMPLANT
TRAY LAPAROSCOPIC MC (CUSTOM PROCEDURE TRAY) ×3 IMPLANT
TROCAR 12M 150ML BLUNT (TROCAR) ×2 IMPLANT
TROCAR XCEL BLUNT TIP 100MML (ENDOMECHANICALS) ×3 IMPLANT
TROCAR XCEL NON-BLD 11X100MML (ENDOMECHANICALS) ×3 IMPLANT
TROCAR XCEL NON-BLD 5MMX100MML (ENDOMECHANICALS) ×3 IMPLANT
TUBING INSUFFLATION (TUBING) ×3 IMPLANT

## 2014-11-20 NOTE — Anesthesia Preprocedure Evaluation (Addendum)
Anesthesia Evaluation  Patient identified by MRN, date of birth, ID band Patient awake    Reviewed: Allergy & Precautions, NPO status , Patient's Chart, lab work & pertinent test results  History of Anesthesia Complications Negative for: history of anesthetic complications  Airway Mallampati: II  TM Distance: >3 FB Neck ROM: Full    Dental  (+) Dental Advisory Given   Pulmonary sleep apnea (does not use CPAP) ,  breath sounds clear to auscultation        Cardiovascular - anginanegative cardio ROS  Rhythm:Regular Rate:Normal     Neuro/Psych Pituitary tumor: Dostinex    GI/Hepatic Neg liver ROS, Acute chole   Endo/Other  Morbid obesity  Renal/GU negative Renal ROS     Musculoskeletal   Abdominal (+) + obese,   Peds  Hematology negative hematology ROS (+)   Anesthesia Other Findings   Reproductive/Obstetrics                            Anesthesia Physical Anesthesia Plan  ASA: III  Anesthesia Plan: General   Post-op Pain Management:    Induction: Intravenous  Airway Management Planned: Oral ETT  Additional Equipment:   Intra-op Plan:   Post-operative Plan: Extubation in OR  Informed Consent: I have reviewed the patients History and Physical, chart, labs and discussed the procedure including the risks, benefits and alternatives for the proposed anesthesia with the patient or authorized representative who has indicated his/her understanding and acceptance.   Dental advisory given  Plan Discussed with: CRNA and Surgeon  Anesthesia Plan Comments: (Plan routine monitors, GETA)        Anesthesia Quick Evaluation

## 2014-11-20 NOTE — Op Note (Signed)
Laparoscopic Cholecystectomy  Procedure Note  Indications: This patient presents with symptomatic gallbladder disease and will undergo laparoscopic cholecystectomy.The procedure has been discussed with the patient. Operative and non operative treatments have been discussed. Risks of surgery include bleeding, infection,  Common bile duct injury,  Injury to the stomach,liver, colon,small intestine, abdominal wall,  Diaphragm,  Major blood vessels,  And the need for an open procedure.  Other risks include worsening of medical problems, death,  DVT and pulmonary embolism, and cardiovascular events.   Medical options have also been discussed. The patient has been informed of long term expectations of surgery and non surgical options,  The patient agrees to proceed.    Pre-operative Diagnosis:acute cholecystitis  Post-operative Diagnosis:  Gangrenous  cholecystitis  Surgeon: Maikel Neisler A.   Assistants: Deon Pilling RNFA   Anesthesia: General endotracheal anesthesia and Local anesthesia 0.25.% bupivacaine, with epinephrine  ASA Class: 2  Procedure Details  The patient was seen again in the Holding Room. The risks, benefits, complications, treatment options, and expected outcomes were discussed with the patient. The possibilities of reaction to medication, pulmonary aspiration, perforation of viscus, bleeding, recurrent infection, finding a normal gallbladder, the need for additional procedures, failure to diagnose a condition, the possible need to convert to an open procedure, and creating a complication requiring transfusion or operation were discussed with the patient. The patient and/or family concurred with the proposed plan, giving informed consent. The site of surgery properly noted/marked. The patient was taken to Operating Room, identified as [facility] and the procedure verified as Laparoscopic Cholecystectomy with Intraoperative Cholangiograms. A Time Out was held and the above information  confirmed.  Prior to the induction of general anesthesia, antibiotic prophylaxis was administered. General endotracheal anesthesia was then administered and tolerated well. After the induction, the abdomen was prepped in the usual sterile fashion. The patient was positioned in the supine position with the left arm comfortably tucked, along with some reverse Trendelenburg.  Local anesthetic agent was injected into the skin near the umbilicus and an incision made. The midline fascia was incised and the Hasson technique was used to introduce a 12 mm port under direct vision. It was secured with a figure of eight Vicryl suture placed in the usual fashion. Pneumoperitoneum was then created with CO2 and tolerated well without any adverse changes in the patient's vital signs. Additional trocars were introduced under direct vision with an 11 mm trocar in the epigastrium and 2 5 mm trocars in the right upper quadrant. All skin incisions were infiltrated with a local anesthetic agent before making the incision and placing the trocars.   The gallbladder was identified, the fundus grasped and retracted cephalad. It was gangrenous and had dense omental adhesions to it.  It was a tedious dissection.  I could not get down to the cystic duct so I opted to staple across the infundibulum after creating a window behind the gallbladder.   Adhesions were lysed bluntly and with the electrocautery where indicated, taking care not to injure any adjacent organs or viscus. The infundibulum was grasped and retracted laterally, exposing the peritoneum overlying the triangle of Calot. This was then divided and exposed in a blunt fashion. A 12 mm epigastric port was placed for the GIA 45 mm stapler and this was fired.        The cystic artery was identified, dissected free, ligated with clips and divided as well. Posterior cystic artery clipped and divided.  The gallbladder was dissected from the liver bed in retrograde  fashion  with the electrocautery. The gallbladder was removed. Cautery and Surgicel placed for hemostasis The liver bed was irrigated and inspected. Hemostasis was achieved with the electrocautery.  60 F drainplaced in the gallbladder fossa. Copious irrigation was utilized and was repeatedly aspirated until clear all particulate matter. Hemostasis was achieved with no signs  Of bleeding or bile leakage. Gallbladder removed in bag through the umbilicus.   Pneumoperitoneum was completely reduced after viewing removal of the trocars under direct vision. The wound was thoroughly irrigated and the fascia was then closed with a figure of eight suture; the skin was then closed with 4 0 MONOCRYL  and a sterile dressing was applied. Drain placed to bulb drainage.   Instrument, sponge, and needle counts were correct at closure and at the conclusion of the case.   Findings: GANGRENOUS GALLBLADDER   Estimated Blood Loss: less than 100 mL         Drains: 19 F         Total IV Fluids: 1046mL         Specimens: Gallbladder           Complications: None; patient tolerated the procedure well.         Disposition: PACU - hemodynamically stable.         Condition: stable

## 2014-11-20 NOTE — Interval H&P Note (Signed)
History and Physical Interval Note:  11/20/2014 11:44 AM  Wesley Little  has presented today for surgery, with the diagnosis of Acute cholelithiasis  The various methods of treatment have been discussed with the patient and family. After consideration of risks, benefits and other options for treatment, the patient has consented to  Procedure(s): LAPAROSCOPIC CHOLECYSTECTOMY WITH INTRAOPERATIVE CHOLANGIOGRAM (N/A) as a surgical intervention .  The patient's history has been reviewed, patient examined, no change in status, stable for surgery.  I have reviewed the patient's chart and labs.  Questions were answered to the patient's satisfaction.  The procedure has been discussed with the patient. Operative and non operative treatments have been discussed. Risks of surgery include bleeding, infection,  Common bile duct injury,  Injury to the stomach,liver, colon,small intestine, abdominal wall,  Diaphragm,  Major blood vessels,  And the need for an open procedure.  Other risks include worsening of medical problems, death,  DVT and pulmonary embolism, and cardiovascular events.   Medical options have also been discussed. The patient has been informed of long term expectations of surgery and non surgical options,  The patient agrees to proceed.     Jrue Yambao A.

## 2014-11-20 NOTE — Anesthesia Postprocedure Evaluation (Signed)
  Anesthesia Post-op Note  Patient: Wesley Little  Procedure(s) Performed: Procedure(s): LAPAROSCOPIC CHOLECYSTECTOMY  (N/A)  Patient Location: PACU  Anesthesia Type:General  Level of Consciousness: awake and alert   Airway and Oxygen Therapy: Patient Spontanous Breathing  Post-op Pain: Controlled  Post-op Assessment: Post-op Vital signs reviewed, Patient's Cardiovascular Status Stable and Respiratory Function Stable  Post-op Vital Signs: Reviewed  Filed Vitals:   11/20/14 1545  BP: 130/72  Pulse: 70  Temp:   Resp: 19    Complications: No apparent anesthesia complications

## 2014-11-20 NOTE — Anesthesia Procedure Notes (Signed)
Procedure Name: Intubation Date/Time: 11/20/2014 12:52 PM Performed by: Rush Farmer E Pre-anesthesia Checklist: Patient identified, Emergency Drugs available, Suction available, Patient being monitored and Timeout performed Patient Re-evaluated:Patient Re-evaluated prior to inductionOxygen Delivery Method: Circle system utilized Preoxygenation: Pre-oxygenation with 100% oxygen Intubation Type: IV induction, Cricoid Pressure applied and Rapid sequence Laryngoscope Size: Mac and 4 Grade View: Grade II Tube type: Oral Tube size: 7.5 mm Number of attempts: 1 Airway Equipment and Method: Stylet and LTA kit utilized Placement Confirmation: ETT inserted through vocal cords under direct vision,  positive ETCO2 and breath sounds checked- equal and bilateral Secured at: 22 cm Tube secured with: Tape Dental Injury: Teeth and Oropharynx as per pre-operative assessment

## 2014-11-20 NOTE — Transfer of Care (Signed)
Immediate Anesthesia Transfer of Care Note  Patient: Wesley Little  Procedure(s) Performed: Procedure(s): LAPAROSCOPIC CHOLECYSTECTOMY  (N/A)  Patient Location: PACU  Anesthesia Type:General  Level of Consciousness: sedated and patient cooperative  Airway & Oxygen Therapy: Patient Spontanous Breathing and Patient connected to face mask oxygen  Post-op Assessment: Report given to RN and Post -op Vital signs reviewed and stable  Post vital signs: Reviewed and stable  Last Vitals:  Filed Vitals:   11/20/14 1108  BP: 131/76  Pulse: 88  Temp: 36.9 C  Resp: 20    Complications: No apparent anesthesia complications

## 2014-11-21 ENCOUNTER — Encounter (HOSPITAL_COMMUNITY): Payer: Self-pay | Admitting: Surgery

## 2014-11-21 DIAGNOSIS — Z79899 Other long term (current) drug therapy: Secondary | ICD-10-CM | POA: Diagnosis not present

## 2014-11-21 DIAGNOSIS — K660 Peritoneal adhesions (postprocedural) (postinfection): Secondary | ICD-10-CM | POA: Diagnosis present

## 2014-11-21 DIAGNOSIS — K810 Acute cholecystitis: Secondary | ICD-10-CM | POA: Diagnosis present

## 2014-11-21 DIAGNOSIS — Z6841 Body Mass Index (BMI) 40.0 and over, adult: Secondary | ICD-10-CM | POA: Diagnosis not present

## 2014-11-21 DIAGNOSIS — R109 Unspecified abdominal pain: Secondary | ICD-10-CM | POA: Diagnosis present

## 2014-11-21 LAB — CBC
HEMATOCRIT: 36.2 % — AB (ref 39.0–52.0)
Hemoglobin: 12 g/dL — ABNORMAL LOW (ref 13.0–17.0)
MCH: 28.6 pg (ref 26.0–34.0)
MCHC: 33.1 g/dL (ref 30.0–36.0)
MCV: 86.2 fL (ref 78.0–100.0)
Platelets: 260 10*3/uL (ref 150–400)
RBC: 4.2 MIL/uL — ABNORMAL LOW (ref 4.22–5.81)
RDW: 12.4 % (ref 11.5–15.5)
WBC: 7 10*3/uL (ref 4.0–10.5)

## 2014-11-21 LAB — COMPREHENSIVE METABOLIC PANEL
ALK PHOS: 67 U/L (ref 38–126)
ALT: 43 U/L (ref 17–63)
AST: 43 U/L — ABNORMAL HIGH (ref 15–41)
Albumin: 2.7 g/dL — ABNORMAL LOW (ref 3.5–5.0)
Anion gap: 8 (ref 5–15)
CHLORIDE: 98 mmol/L — AB (ref 101–111)
CO2: 28 mmol/L (ref 22–32)
Calcium: 8.2 mg/dL — ABNORMAL LOW (ref 8.9–10.3)
Creatinine, Ser: 0.88 mg/dL (ref 0.61–1.24)
GLUCOSE: 138 mg/dL — AB (ref 65–99)
Potassium: 4 mmol/L (ref 3.5–5.1)
SODIUM: 134 mmol/L — AB (ref 135–145)
TOTAL PROTEIN: 6.4 g/dL — AB (ref 6.5–8.1)
Total Bilirubin: 0.7 mg/dL (ref 0.3–1.2)

## 2014-11-21 MED ORDER — DEXTROSE 5 % IV SOLN
2.0000 g | INTRAVENOUS | Status: DC
  Administered 2014-11-21 – 2014-11-23 (×3): 2 g via INTRAVENOUS
  Filled 2014-11-21 (×3): qty 2

## 2014-11-21 MED ORDER — HYDROMORPHONE HCL 1 MG/ML IJ SOLN: 1.0000 mg | INTRAMUSCULAR | Status: DC | PRN

## 2014-11-21 MED ORDER — CALCIUM CARBONATE ANTACID 500 MG PO CHEW: 1.0000 | CHEWABLE_TABLET | Freq: Every day | ORAL | Status: DC | PRN

## 2014-11-21 MED ORDER — SIMETHICONE 80 MG PO CHEW: 160.0000 mg | CHEWABLE_TABLET | Freq: Every day | ORAL | Status: DC | PRN

## 2014-11-21 MED ORDER — DOCUSATE SODIUM 100 MG PO CAPS
100.0000 mg | ORAL_CAPSULE | Freq: Two times a day (BID) | ORAL | Status: DC
  Administered 2014-11-21 – 2014-11-22 (×4): 100 mg via ORAL
  Filled 2014-11-21 (×4): qty 1

## 2014-11-21 NOTE — Progress Notes (Signed)
[facility] Progress Note  1 Day Post-Op  Subjective: Pt doing well.  No N/V.  Hungry.  Ambulating well.  Had some right shoulder pain and bloating.    Objective: Vital signs in last 24 hours: Temp:  [97.7 F (36.5 C)-98.6 F (37 C)] 98.1 F (36.7 C) (08/12 0500) Pulse Rate:  [64-88] 75 (08/12 0500) Resp:  [18-25] 20 (08/12 0500) BP: (102-131)/(42-76) 111/68 mmHg (08/12 0500) SpO2:  [93 %-100 %] 95 % (08/12 0500) Last BM Date: 11/18/14  Intake/Output from previous day: 08/11 0701 - 08/12 0700 In: 1480 [P.O.:480; I.V.:1000] Out: 1150 [Urine:850; Drains:100; Blood:100] Intake/Output this shift:    PE: Gen:  Alert, NAD, pleasant Abd: Soft, mild distension, mild tenderness, +BS, no HSM, incisions C/D/I, drain with sanguinous drainage (132mL/24hr)   Lab Results:   Recent Labs  11/20/14 0518 11/21/14 0403  WBC 8.2 7.0  HGB 13.3 12.0*  HCT 40.2 36.2*  PLT 242 260   BMET  Recent Labs  11/20/14 0518 11/21/14 0403  NA 137 134*  K 3.5 4.0  CL 98* 98*  CO2 30 28  GLUCOSE 112* 138*  BUN <5* <5*  CREATININE 0.97 0.88  CALCIUM 8.6* 8.2*   PT/INR No results for input(s): LABPROT, INR in the last 72 hours. CMP     Component Value Date/Time   NA 134* 11/21/2014 0403   K 4.0 11/21/2014 0403   CL 98* 11/21/2014 0403   CO2 28 11/21/2014 0403   GLUCOSE 138* 11/21/2014 0403   BUN <5* 11/21/2014 0403   CREATININE 0.88 11/21/2014 0403   CALCIUM 8.2* 11/21/2014 0403   PROT 6.4* 11/21/2014 0403   ALBUMIN 2.7* 11/21/2014 0403   AST 43* 11/21/2014 0403   ALT 43 11/21/2014 0403   ALKPHOS 67 11/21/2014 0403   BILITOT 0.7 11/21/2014 0403   GFRNONAA >60 11/21/2014 0403   GFRAA >60 11/21/2014 0403   Lipase     Component Value Date/Time   LIPASE 15* 11/19/2014 1517       Studies/Results: US Abdomen Limited  11/19/2014   CLINICAL DATA:  Right upper quadrant pain  EXAM: US ABDOMEN LIMITED - RIGHT UPPER QUADRANT  COMPARISON:  November 17, 2014  FINDINGS:  Gallbladder:  There is an echogenic focus adherent in the neck of the gallbladder measuring 1.7 cm in length. This echogenic focus shadows and is felt to represent an adherent gallstone. The gallbladder wall is thickened and mildly edematous. There is focal tenderness over the gallbladder. There is no pericholecystic fluid.  Common bile duct:  Diameter: 5 mm. There is no intrahepatic or extrahepatic biliary duct dilatation.  Liver:  No focal lesion identified. Within normal limits in parenchymal echogenicity.  IMPRESSION: Evidence of a calculus adherent in the neck of the gallbladder with gallbladder wall thickening and positive Murphy's sign. These are findings indicative of acute cholecystitis.   Electronically Signed   By: Lowella Grip III M.D.   On: 11/19/2014 18:52    Anti-infectives: Anti-infectives    Start     Dose/Rate Route Frequency Ordered Stop   11/20/14 1300  piperacillin-tazobactam (ZOSYN) IVPB 3.375 g  Status:  Discontinued     3.375 g 100 mL/hr over 30 Minutes Intravenous To Surgery 11/20/14 1246 11/20/14 1626   11/19/14 2000  piperacillin-tazobactam (ZOSYN) IVPB 3.375 g     3.375 g 100 mL/hr over 30 Minutes Intravenous  Once 11/19/14 1947 11/20/14 1255       Assessment/Plan Gangrenous cholecystitis -Advance diet as tolerated -IVF, pain control, antiemetics -  Needs 7 days of antibiotics (rocephin while here) -Likely needs to stay here a day -Ambulate and IS -Colace, gas x -Add ice -Continue drain, consider discontinuing at discharge    LOS: 2 days    Wesley Little 11/21/2014, 7:47 AM Pager: [phone]

## 2014-11-22 MED ORDER — HYDROMORPHONE HCL 2 MG PO TABS
2.0000 mg | ORAL_TABLET | ORAL | Status: DC | PRN
  Administered 2014-11-22 – 2014-11-23 (×5): 2 mg via ORAL
  Filled 2014-11-22 (×5): qty 1

## 2014-11-22 MED ORDER — BISACODYL 10 MG RE SUPP: 10.0000 mg | Freq: Every day | RECTAL | Status: DC | PRN

## 2014-11-22 NOTE — Progress Notes (Signed)
Pt ambulated around unit and tolerated well. Wesley Little

## 2014-11-22 NOTE — Progress Notes (Signed)
Patient ID: Wesley Little, male   DOB: [birth year], 54 y.o.   MRN: [id number] 2 Days Post-Op  Subjective: Pt feeling a little better, but percocet gave him hallucinations.  Eating a very small amount.  Minimal flatus  Objective: Vital signs in last 24 hours: Temp:  [97.4 F (36.3 C)-98.6 F (37 C)] 98.6 F (37 C) (08/13 0546) Pulse Rate:  [66-75] 68 (08/13 0546) Resp:  [18-20] 18 (08/13 0546) BP: (115-127)/(59-69) 122/64 mmHg (08/13 0546) SpO2:  [93 %-96 %] 93 % (08/13 0546) Last BM Date: 11/18/14  Intake/Output from previous day: 08/12 0701 - 08/13 0700 In: 440 [P.O.:440] Out: 170 [Drains:170] Intake/Output this shift:    PE: Abd: soft, appropriately tender, drain is serosang, +BS, incisions c/d/i HEart: regular Lungs: CTAB  Lab Results:   Recent Labs  11/20/14 0518 11/21/14 0403  WBC 8.2 7.0  HGB 13.3 12.0*  HCT 40.2 36.2*  PLT 242 260   BMET  Recent Labs  11/20/14 0518 11/21/14 0403  NA 137 134*  K 3.5 4.0  CL 98* 98*  CO2 30 28  GLUCOSE 112* 138*  BUN <5* <5*  CREATININE 0.97 0.88  CALCIUM 8.6* 8.2*   PT/INR No results for input(s): LABPROT, INR in the last 72 hours. CMP     Component Value Date/Time   NA 134* 11/21/2014 0403   K 4.0 11/21/2014 0403   CL 98* 11/21/2014 0403   CO2 28 11/21/2014 0403   GLUCOSE 138* 11/21/2014 0403   BUN <5* 11/21/2014 0403   CREATININE 0.88 11/21/2014 0403   CALCIUM 8.2* 11/21/2014 0403   PROT 6.4* 11/21/2014 0403   ALBUMIN 2.7* 11/21/2014 0403   AST 43* 11/21/2014 0403   ALT 43 11/21/2014 0403   ALKPHOS 67 11/21/2014 0403   BILITOT 0.7 11/21/2014 0403   GFRNONAA >60 11/21/2014 0403   GFRAA >60 11/21/2014 0403   Lipase     Component Value Date/Time   LIPASE 15* 11/19/2014 1517       Studies/Results: No results found.  Anti-infectives: Anti-infectives    Start     Dose/Rate Route Frequency Ordered Stop   11/21/14 0830  cefTRIAXone (ROCEPHIN) 2 g in dextrose 5 % 50 mL IVPB    Comments:   Pharmacy may adjust dosing strength / duration / interval for maximal efficacy   2 g 100 mL/hr over 30 Minutes Intravenous Every 24 hours 11/21/14 0827     11/20/14 1300  piperacillin-tazobactam (ZOSYN) IVPB 3.375 g  Status:  Discontinued     3.375 g 100 mL/hr over 30 Minutes Intravenous To Surgery 11/20/14 1246 11/20/14 1626   11/19/14 2000  piperacillin-tazobactam (ZOSYN) IVPB 3.375 g     3.375 g 100 mL/hr over 30 Minutes Intravenous  Once 11/19/14 1947 11/20/14 1255       Assessment/Plan  Gangrenous cholecystitis, POD 2, s/p lap chole, Dr. Brantley Stage -cont regular diet  -change oral pain meds to oral dilaudid and he does not tolerate norco or percocet -Needs 7 days of antibiotics (rocephin while here) -Ambulate and IS -Colace, gas x, dulcolax prn -Add ice -Continue drain -likely home tomorrow    LOS: 3 days    Wesley Little 11/22/2014, 9:51 AM Pager: [phone]

## 2014-11-23 MED ORDER — AMOXICILLIN-POT CLAVULANATE 875-125 MG PO TABS: 1.0000 | ORAL_TABLET | Freq: Two times a day (BID) | ORAL | Status: DC

## 2014-11-23 MED ORDER — HYDROMORPHONE HCL 2 MG PO TABS: 2.0000 mg | ORAL_TABLET | ORAL | Status: DC | PRN

## 2014-11-23 NOTE — Progress Notes (Signed)
Wesley Little to be D/C'd Home per MD order.  Discussed with the patient and all questions fully answered.  VSS, Skin clean, dry and intact without evidence of skin break down, no evidence of skin tears noted. IV catheter discontinued intact. Site without signs and symptoms of complications. Dressing and pressure applied.  An After Visit Summary was printed and given to the patient. Patient received prescriptions  D/c education completed with patient/family including follow up instructions, medication list, d/c activities limitations if indicated, with other d/c instructions as indicated by MD - patient able to verbalize understanding, all questions fully answered.   Patient instructed to return to ED, call 911, or call MD for any changes in condition.   Patient escorted via [place], and D/C home via private auto.  [name] 11/23/2014 1010

## 2014-11-23 NOTE — Discharge Instructions (Signed)
Your appointment is at 1:45pm, please arrive at least 30 min before your appointment to complete your check in paperwork.  If you are unable to arrive 30 min prior to your appointment time we may have to cancel or reschedule you.  Bulb Drain Home Care A bulb drain consists of a thin rubber tube and a soft, round bulb that creates a gentle suction. The rubber tube is placed in the area where you had surgery. A bulb is attached to the end of the tube that is outside the body. The bulb drain removes excess fluid that normally builds up in a surgical wound after surgery. The color and amount of fluid will vary. Immediately after surgery, the fluid is bright red and is a little thicker than water. It may gradually change to a yellow or pink color and become more thin and water-like. When the amount decreases to about 1 or 2 tbsp in 24 hours, your health care provider will usually remove it. DAILY CARE  Keep the bulb flat (compressed) at all times, except while emptying it. The flatness creates suction. You can flatten the bulb by squeezing it firmly in the middle and then closing the cap.  Keep sites where the tube enters the skin dry and covered with a bandage (dressing).  Secure the tube 1-2 in (2.5-5.1 cm) below the insertion sites to keep it from pulling on your stitches. The tube is stitched in place and will not slip out.  Secure the bulb as directed by your health care provider.  For the first 3 days after surgery, there usually is more fluid in the bulb. Empty the bulb whenever it becomes half full because the bulb does not create enough suction if it is too full. The bulb could also overflow. Write down how much fluid you remove each time you empty your drain. Add up the amount removed in 24 hours.  Empty the bulb at the same time every day once the amount of fluid decreases and you only need to empty it once a day. Write down the amounts and the 24-hour totals to give to your health care  provider. This helps your health care provider know when the tubes can be removed. EMPTYING THE BULB DRAIN Before emptying the bulb, get a measuring cup, a piece of paper and a pen, and wash your hands.  Gently run your fingers down the tube (stripping) to empty any drainage from the tubing into the bulb. This may need to be done several times a day to clear the tubing of clots and tissue.  Open the bulb cap to release suction, which causes it to inflate. Do not touch the inside of the cap.  Gently run your fingers down the tube (stripping) to empty any drainage from the tubing into the bulb.  Hold the cap out of the way, and pour fluid into the measuring cup.   Squeeze the bulb to provide suction.  Replace the cap.   Check the tape that holds the tube to your skin. If it is becoming loose, you can remove the loose piece of tape and apply a new one. Then, pin the bulb to your shirt.   Write down the amount of fluid you emptied out. Write down the date and each time you emptied your bulb drain. (If there are 2 bulbs, note the amount of drainage from each bulb and keep the totals separate. Your health care provider will want to know the total amounts for each drain  and which tube is draining more.)   Flush the fluid down the toilet and wash your hands.   Call your health care provider once you have less than 2 tbsp of fluid collecting in the bulb drain every 24 hours. If there is drainage around the tube site, change dressings and keep the area dry. Cleanse around tube with sterile saline and place dry gauze around site. This gauze should be changed when it is soiled. If it stays clean and unsoiled, it should still be changed daily.  SEEK MEDICAL CARE IF:  Your drainage has a bad smell or is cloudy.   You have a fever.   Your drainage is increasing instead of decreasing.   Your tube fell out.   You have redness or swelling around the tube site.   You have drainage from a  surgical wound.   Your bulb drain will not stay flat after you empty it.  MAKE SURE YOU:   Understand these instructions.  Will watch your condition.  Will get help right away if you are not doing well or get worse. Document Released: 03/25/2000 Document Revised: 08/12/2013 Document Reviewed: 08/31/2011 [facility] Patient Information 2015 [place], [place]. This information is not intended to replace advice given to you by your health care provider. Make sure you discuss any questions you have with your health care provider.    LAPAROSCOPIC SURGERY: POST OP INSTRUCTIONS  1. DIET: Follow a light bland diet the first 24 hours after arrival home, such as soup, liquids, crackers, etc. Be sure to include lots of fluids daily. Avoid fast food or heavy meals as your are more likely to get nauseated. Eat a low fat the next few days after surgery.  2. Take your usually prescribed home medications unless otherwise directed. 3. PAIN CONTROL:  1. Pain is best controlled by a usual combination of three different methods TOGETHER:  1. Ice/Heat 2. Over the counter pain medication 3. Prescription pain medication 2. Most patients will experience some swelling and bruising around the incisions. Ice packs or heating pads (30-60 minutes up to 6 times a day) will help. Use ice for the first few days to help decrease swelling and bruising, then switch to heat to help relax tight/sore spots and speed recovery. Some people prefer to use ice alone, heat alone, alternating between ice & heat. Experiment to what works for you. Swelling and bruising can take several weeks to resolve.  3. It is helpful to take an over-the-counter pain medication regularly for the first few weeks. Choose one of the following that works best for you:  1. Naproxen (Aleve, etc) Two 220mg  tabs twice a day 2. Ibuprofen (Advil, etc) Three 200mg  tabs four times a day (every meal & bedtime) 3. Acetaminophen (Tylenol, etc) 500-650mg  four times a  day (every meal & bedtime) 4. A prescription for pain medication (such as oxycodone, hydrocodone, etc) should be given to you upon discharge. Take your pain medication as prescribed.  1. If you are having problems/concerns with the prescription medicine (does not control pain, nausea, vomiting, rash, itching, etc), please call us [phone] to see if we need to switch you to a different pain medicine that will work better for you and/or control your side effect better. 2. If you need a refill on your pain medication, please contact your pharmacy. They will contact our office to request authorization. Prescriptions will not be filled after 5 pm or on week-ends. 4. Avoid getting constipated. Between the surgery and the  pain medications, it is common to experience some constipation. Increasing fluid intake and taking a fiber supplement (such as Metamucil, Citrucel, FiberCon, MiraLax, etc) 1-2 times a day regularly will usually help prevent this problem from occurring. A mild laxative (prune juice, Milk of Magnesia, MiraLax, etc) should be taken according to package directions if there are no bowel movements after 48 hours.  5. Watch out for diarrhea. If you have many loose bowel movements, simplify your diet to bland foods & liquids for a few days. Stop any stool softeners and decrease your fiber supplement. Switching to mild anti-diarrheal medications (Kayopectate, Pepto Bismol) can help. If this worsens or does not improve, please call us. 6. Wash / shower every day. You may shower over the dressings as they are waterproof. Continue to shower over incision(s) after the dressing is off. 7. Remove your waterproof bandages 5 days after surgery. You may leave the incision open to air. You may replace a dressing/Band-Aid to cover the incision for comfort if you wish.  8. ACTIVITIES as tolerated:  1. You may resume regular (light) daily activities beginning the next day--such as daily self-care, walking,  climbing stairs--gradually increasing activities as tolerated. If you can walk 30 minutes without difficulty, it is safe to try more intense activity such as jogging, treadmill, bicycling, low-impact aerobics, swimming, etc. 2. Save the most intensive and strenuous activity for last such as sit-ups, heavy lifting, contact sports, etc Refrain from any heavy lifting or straining until you are off narcotics for pain control.  3. DO NOT PUSH THROUGH PAIN. Let pain be your guide: If it hurts to do something, don't do it. Pain is your body warning you to avoid that activity for another week until the pain goes down. 4. You may drive when you are no longer taking prescription pain medication, you can comfortably wear a seatbelt, and you can safely maneuver your car and apply brakes. 5. You may have sexual intercourse when it is comfortable.  9. FOLLOW UP in our office  1. Please call [facility] at [phone] to set up an appointment to see your surgeon in the office for a follow-up appointment approximately 2-3 weeks after your surgery. 2. Make sure that you call for this appointment the day you arrive home to insure a convenient appointment time.      10. IF YOU HAVE DISABILITY OR FAMILY LEAVE FORMS, BRING THEM TO THE               OFFICE FOR PROCESSING.   WHEN TO CALL us [phone]:  1. Poor pain control 2. Reactions / problems with new medications (rash/itching, nausea, etc)  3. Fever over 101.5 F (38.5 C) 4. Inability to urinate 5. Nausea and/or vomiting 6. Worsening swelling or bruising 7. Continued bleeding from incision. 8. Increased pain, redness, or drainage from the incision  The clinic staff is available to answer your questions during regular business hours (8:30am-5pm). Please dont hesitate to call and ask to speak to one of our nurses for clinical concerns.  If you have a medical emergency, go to the nearest emergency room or call 911.  A surgeon from [facility] is always  on call at the [facility], [place], [place], [place], [place] [address] ?  MAIN: [phone] ? TOLL FREE: [phone] ?  FAX [phone]  [email or web address]

## 2014-11-23 NOTE — Discharge Summary (Signed)
Patient ID: Wesley Little MRN: [id number] DOB/AGE: 06/18/1960 54 y.o.  Admit date: 11/19/2014 Discharge date: 11/23/2014  Procedures: lap chole  Consults: None  Reason for Admission:  Started several days ago, had [place] two days ago suggesting gallstones, no Murphy's sign or acute findings. Today, examination changed. Acute GB on ultrasound today. No other studies necessary  Admission Diagnoses:  1. Acute cholecystitis  Hospital Course: The patient was admitted and started on Rocephin.  He was taken to the OR where he underwent a lap chole, but a stapler was used to staple across the infundibulum due to an acutely gangrenous cholecystitis.  IOC was unable to be obtained.  The patient continued on abx therapy during his stay. He had a JP drain in that remained serosang.  It took him several days to recover due to pain and bowel function.  His diet was able to be advanced as tolerated and he started passing flatus better on POD 2.  On POD 3, he was much improved and was stable to dc home with his JP drain in place.  PE: Abd: soft, appropriately tender, +BS, obese, incisions c/d/i, JP drain with serous output  Discharge Diagnoses:  Active Problems:   Acute cholecystitis s/p lap chole  Discharge Medications:   Medication List    STOP taking these medications        HYDROcodone-acetaminophen 5-325 MG per tablet  Commonly known as:  NORCO      TAKE these medications        acetaminophen 500 MG tablet  Commonly known as:  TYLENOL  Take 1,000 mg by mouth every 6 (six) hours as needed for mild pain or moderate pain.     amoxicillin-clavulanate 875-125 MG per tablet  Commonly known as:  AUGMENTIN  Take 1 tablet by mouth 2 (two) times daily.     cabergoline 0.5 MG tablet  Commonly known as:  DOSTINEX  Take 0.25 mg by mouth 2 (two) times a week.     calcium carbonate 500 MG chewable tablet  Commonly known as:  TUMS - dosed in mg elemental calcium  Chew 1 tablet by mouth daily  as needed for indigestion or heartburn.     GAS-X PO  Take 1 tablet by mouth daily as needed (flatuelence).     HYDROmorphone 2 MG tablet  Commonly known as:  DILAUDID  Take 1-2 tablets (2-4 mg total) by mouth every 4 (four) hours as needed for severe pain.     MULTIVITAMIN & MINERAL PO  Take 1 tablet by mouth daily.     peppermint oil liquid  Apply 1 application topically as needed (body pain).        Discharge Instructions:     Follow-up Information    Follow up with [facility] [place] On 12/02/2014.   Why:  For post-operation check. Your appointment is at 1:45pm, please arrive at least 30 min before your appointment to complete your check in paperwork.  If you are unable to arrive 30 min prior to your appointment time we may have to cancel or reschedule you   Contact information:   [address] [id number] [phone]      Signed: Henreitta Cea 11/23/2014, 9:13 AM

## 2015-10-06 ENCOUNTER — Other Ambulatory Visit: Payer: Self-pay | Admitting: Internal Medicine

## 2015-10-06 ENCOUNTER — Ambulatory Visit
Admission: RE | Admit: 2015-10-06 | Discharge: 2015-10-06 | Disposition: A | Discharge status: Home / Self Care | Payer: 59 | Source: Ambulatory Visit | Attending: Internal Medicine | Admitting: Internal Medicine

## 2015-10-06 DIAGNOSIS — M25561 Pain in right knee: Secondary | ICD-10-CM

## 2015-10-06 DIAGNOSIS — R52 Pain, unspecified: Secondary | ICD-10-CM

## 2015-11-24 ENCOUNTER — Other Ambulatory Visit: Payer: Self-pay | Admitting: Ophthalmology

## 2017-06-26 DIAGNOSIS — J019 Acute sinusitis, unspecified: Secondary | ICD-10-CM | POA: Diagnosis not present

## 2017-09-15 DIAGNOSIS — R350 Frequency of micturition: Secondary | ICD-10-CM | POA: Diagnosis not present

## 2017-10-10 DIAGNOSIS — D352 Benign neoplasm of pituitary gland: Secondary | ICD-10-CM | POA: Diagnosis not present

## 2017-10-10 DIAGNOSIS — Z0000 Encounter for general adult medical examination without abnormal findings: Secondary | ICD-10-CM | POA: Diagnosis not present

## 2017-10-24 DIAGNOSIS — M546 Pain in thoracic spine: Secondary | ICD-10-CM | POA: Diagnosis not present

## 2017-10-24 DIAGNOSIS — M5414 Radiculopathy, thoracic region: Secondary | ICD-10-CM | POA: Diagnosis not present

## 2017-10-24 DIAGNOSIS — M7912 Myalgia of auxiliary muscles, head and neck: Secondary | ICD-10-CM | POA: Diagnosis not present

## 2017-12-04 DIAGNOSIS — M25562 Pain in left knee: Secondary | ICD-10-CM | POA: Diagnosis not present

## 2017-12-04 DIAGNOSIS — M170 Bilateral primary osteoarthritis of knee: Secondary | ICD-10-CM | POA: Diagnosis not present

## 2017-12-04 DIAGNOSIS — G894 Chronic pain syndrome: Secondary | ICD-10-CM | POA: Diagnosis not present

## 2019-04-11 ENCOUNTER — Emergency Department (HOSPITAL_COMMUNITY)
Admission: EM | Admit: 2019-04-11 | Discharge: 2019-04-11 | Disposition: A | Discharge status: Home / Self Care | Payer: 59 | Attending: Emergency Medicine | Admitting: Emergency Medicine

## 2019-04-11 ENCOUNTER — Encounter (HOSPITAL_COMMUNITY): Payer: Self-pay | Admitting: *Deleted

## 2019-04-11 ENCOUNTER — Other Ambulatory Visit: Payer: Self-pay

## 2019-04-11 DIAGNOSIS — N3090 Cystitis, unspecified without hematuria: Secondary | ICD-10-CM | POA: Diagnosis not present

## 2019-04-11 DIAGNOSIS — N3000 Acute cystitis without hematuria: Secondary | ICD-10-CM

## 2019-04-11 DIAGNOSIS — R300 Dysuria: Secondary | ICD-10-CM | POA: Diagnosis present

## 2019-04-11 LAB — URINALYSIS, ROUTINE W REFLEX MICROSCOPIC
Bacteria, UA: NONE SEEN
Bilirubin Urine: NEGATIVE
Glucose, UA: NEGATIVE mg/dL
Hgb urine dipstick: NEGATIVE
Ketones, ur: NEGATIVE mg/dL
Nitrite: POSITIVE — AB
Protein, ur: NEGATIVE mg/dL
Specific Gravity, Urine: 1.023 (ref 1.005–1.030)
pH: 6 (ref 5.0–8.0)

## 2019-04-11 MED ORDER — CIPROFLOXACIN HCL 500 MG PO TABS: 500.0000 mg | ORAL_TABLET | Freq: Two times a day (BID) | ORAL | 0 refills | Status: DC

## 2019-04-11 NOTE — ED Provider Notes (Signed)
[facility] DEPT Provider Note   CSN: [id number] Arrival date & time: 04/11/19  1707     History Chief Complaint  Patient presents with  . Dysuria    Wesley Little is a 58 y.o. male.  58 yo M with a chief complaint of pain with urination.  Going on for the past 3 to 4 days.  Patient states he has had a urinary tract infection before and it felt the same.  Was seen by his family doctor at the onset and was felt to have a clean urine sample.  Had been seen by an urgent care facility via televisit yesterday and was started on antibiotics.  He is not sure exactly which one.  Has not had any significant improvement and so came to the ED today.  He denies flank pain denies fever.  Denies nausea.  States that he is able to urinate normally and then feels like he has a lot of difficulty voiding towards the end of his urinary stream.  Started after receiving oral sex from his wife.  Prior time that he had a urinary tract infection was after a similar event.   Dysuria Presenting symptoms: dysuria   Presenting symptoms: no penile discharge and no penile pain   Context: after intercourse   Relieved by:  Nothing Worsened by:  Nothing Ineffective treatments:  None tried Associated symptoms: no abdominal pain, no diarrhea, no fever, no flank pain, no penile swelling and no vomiting        Past Medical History:  Diagnosis Date  . Pituitary tumor   . Sleep apnea     Patient Active Problem List   Diagnosis Date Noted  . Acute cholecystitis 11/19/2014    Past Surgical History:  Procedure Laterality Date  . ACHILLES TENDON REPAIR    . CHOLECYSTECTOMY N/A 11/20/2014   Procedure: LAPAROSCOPIC CHOLECYSTECTOMY ;  Surgeon: Erroll Luna, MD;  Location: [facility];  Service: General;  Laterality: N/A;       No family history on file.  Social History   Tobacco Use  . Smoking status: Never Smoker  . Smokeless tobacco: Never Used  Substance Use Topics  .  Alcohol use: No  . Drug use: No    Home Medications Prior to Admission medications   Medication Sig Start Date End Date Taking? Authorizing Provider  acetaminophen (TYLENOL) 500 MG tablet Take 1,000 mg by mouth every 6 (six) hours as needed for mild pain or moderate pain.   Yes [provider]  azithromycin (ZITHROMAX) 500 MG tablet Take 1,000 mg by mouth once. 04/10/19  Yes [provider]  cabergoline (DOSTINEX) 0.5 MG tablet Take 0.25 mg by mouth 2 (two) times a week.  10/16/14  Yes [provider]  calcium carbonate (TUMS - DOSED IN MG ELEMENTAL CALCIUM) 500 MG chewable tablet Chew 1 tablet by mouth daily as needed for indigestion or heartburn.   Yes [provider]  doxycycline (VIBRAMYCIN) 100 MG capsule Take 100 mg by mouth 2 (two) times daily. Started 12.30.2020 will end 1.6.2021 04/10/19  Yes [provider]  Multiple Vitamins-Minerals (MULTIVITAMIN & MINERAL PO) Take 1 tablet by mouth daily.   Yes [provider]  phenazopyridine (PYRIDIUM) 200 MG tablet Take 200 mg by mouth 3 (three) times daily. 04/10/19  Yes [provider]  Simethicone (GAS-X PO) Take 1 tablet by mouth daily as needed (flatuelence).   Yes [provider]  amoxicillin-clavulanate (AUGMENTIN) 875-125 MG per tablet Take 1 tablet  by mouth 2 (two) times daily. Patient not taking: Reported on 04/11/2019 11/23/14   Saverio Danker, PA-C  ciprofloxacin (CIPRO) 500 MG tablet Take 1 tablet (500 mg total) by mouth 2 (two) times daily. One po bid x 7 days 04/11/19   Deno Etienne, DO  HYDROmorphone (DILAUDID) 2 MG tablet Take 1-2 tablets (2-4 mg total) by mouth every 4 (four) hours as needed for severe pain. Patient not taking: Reported on 04/11/2019 11/23/14   Saverio Danker, PA-C    Allergies    Patient has no known allergies.  Review of Systems   Review of Systems  Constitutional: Negative for chills and fever.  HENT: Negative for congestion and facial  swelling.   Eyes: Negative for discharge and visual disturbance.  Respiratory: Negative for shortness of breath.   Cardiovascular: Negative for chest pain and palpitations.  Gastrointestinal: Negative for abdominal pain, diarrhea and vomiting.  Genitourinary: Positive for dysuria. Negative for discharge, flank pain, penile pain, penile swelling and testicular pain.  Musculoskeletal: Negative for arthralgias and myalgias.  Skin: Negative for color change and rash.  Neurological: Negative for tremors, syncope and headaches.  Psychiatric/Behavioral: Negative for confusion and dysphoric mood.    Physical Exam Updated Vital Signs BP 124/77 (BP Location: Left Arm)   Pulse 71   Temp 98.1 F (36.7 C) (Oral)   Resp 15   Ht 5\' 8"  (1.727 m)   Wt (!) 141.5 kg   SpO2 99%   BMI 47.44 kg/m   Physical Exam Vitals and nursing note reviewed.  Constitutional:      Appearance: He is well-developed.  HENT:     Head: Normocephalic and atraumatic.  Eyes:     Pupils: Pupils are equal, round, and reactive to light.  Neck:     Vascular: No JVD.  Cardiovascular:     Rate and Rhythm: Normal rate and regular rhythm.     Heart sounds: No murmur. No friction rub. No gallop.   Pulmonary:     Effort: No respiratory distress.     Breath sounds: No wheezing.  Abdominal:     General: There is no distension.     Tenderness: There is no abdominal tenderness. There is no guarding or rebound.  Genitourinary:    Comments: Prostate seems normal in size, there is no significant tenderness to palpation. Musculoskeletal:        General: Normal range of motion.     Cervical back: Normal range of motion and neck supple.  Skin:    Coloration: Skin is not pale.     Findings: No rash.  Neurological:     Mental Status: He is alert and oriented to person, place, and time.  Psychiatric:        Behavior: Behavior normal.     ED Results / Procedures / Treatments   Labs (all labs ordered are listed, but only  abnormal results are displayed) Labs Reviewed  URINALYSIS, ROUTINE W REFLEX MICROSCOPIC - Abnormal; Notable for the following components:      Result Value   Color, Urine ORANGE (*)    Nitrite POSITIVE (*)    Leukocytes,Ua TRACE (*)    All other components within normal limits    EKG None  Radiology No results found.  Procedures Procedures (including critical care time)  Medications Ordered in ED Medications - No data to display  ED Course  I have reviewed the triage vital signs and the nursing notes.  Pertinent labs & imaging results that were available during my  care of the patient were reviewed by me and considered in my medical decision making (see chart for details).    MDM Rules/Calculators/A&P                      58 yo M with a chief complaint of dysuria and difficulty completely voiding his bladder.  Going on for about a week.  Had a similar presentation in the past and was diagnosed with a urinary tract infection.  Prostate is not significantly tender on my exam.  Will obtain a UA.  Bladder scan with only 59 cc of urine.  UA is nitrate positive.  Patient is currently on antibiotics but is unsure which one.  Discussed with him that he is less than 48 hours of onset of antibiotics so he likely needs to wait to see if it will improve his symptoms.  I prescribed him ciprofloxacin in case that fails as I wonder if he does not have a component of prostate involvement.  Have given him information for urology follow-up.  7:35 PM:  I have discussed the diagnosis/risks/treatment options with the patient and believe the pt to be eligible for discharge home to follow-up with Urology, PCP. We also discussed returning to the ED immediately if new or worsening sx occur. We discussed the sx which are most concerning (e.g., sudden worsening pain, fever, inability to tolerate by mouth) that necessitate immediate return. Medications administered to the patient during their visit and any new  prescriptions provided to the patient are listed below.  Medications given during this visit Medications - No data to display   The patient appears reasonably screen and/or stabilized for discharge and I doubt any other medical condition or other [facility] requiring further screening, evaluation, or treatment in the ED at this time prior to discharge.   Final Clinical Impression(s) / ED Diagnoses Final diagnoses:  Acute cystitis without hematuria    Rx / DC Orders ED Discharge Orders         Ordered    ciprofloxacin (CIPRO) 500 MG tablet  2 times daily     04/11/19 1931           Deno Etienne, DO 04/11/19 1935

## 2019-04-11 NOTE — Discharge Instructions (Signed)
I do not typical for men to get urinary tract infections.  As you have had to this is something that he may want to see a urologist and have this further evaluated.  He has not yet been 48 hours after the onset of your antibiotic.  Usually we wait for 48 hours before changing antibiotics.  I prescribed you an antibiotic to not fill it unless you have past 48 hours and are feeling worse or developing a fever or back pain.  Please discuss this with your family doctor on Monday when they reopen.

## 2019-04-11 NOTE — ED Notes (Signed)
Urine Culture sent to lab pending order.

## 2019-04-11 NOTE — ED Triage Notes (Signed)
Pt states he has had urinary difficultly for a couple of weeks. He has been seen by his PCP and told he has some bacteria in his urine to increase po intake. Went to clinic and was started on antibiotics, worse today. Pain and burning especially at end of urination.

## 2020-04-07 ENCOUNTER — Other Ambulatory Visit: Payer: Self-pay | Admitting: Internal Medicine

## 2020-04-07 ENCOUNTER — Ambulatory Visit
Admission: RE | Admit: 2020-04-07 | Discharge: 2020-04-07 | Disposition: A | Discharge status: Home / Self Care | Payer: 59 | Source: Ambulatory Visit | Attending: Internal Medicine | Admitting: Internal Medicine

## 2020-04-07 DIAGNOSIS — R0781 Pleurodynia: Secondary | ICD-10-CM

## 2020-07-31 ENCOUNTER — Other Ambulatory Visit: Payer: Self-pay | Admitting: Internal Medicine

## 2020-07-31 DIAGNOSIS — R109 Unspecified abdominal pain: Secondary | ICD-10-CM

## 2020-08-13 ENCOUNTER — Ambulatory Visit
Admission: RE | Admit: 2020-08-13 | Discharge: 2020-08-13 | Disposition: A | Discharge status: Home / Self Care | Payer: 59 | Source: Ambulatory Visit | Attending: Internal Medicine | Admitting: Internal Medicine

## 2020-08-13 ENCOUNTER — Other Ambulatory Visit: Payer: Self-pay

## 2020-08-13 DIAGNOSIS — R109 Unspecified abdominal pain: Secondary | ICD-10-CM

## 2021-07-13 ENCOUNTER — Other Ambulatory Visit: Payer: Self-pay | Admitting: Internal Medicine

## 2021-07-13 ENCOUNTER — Ambulatory Visit
Admission: RE | Admit: 2021-07-13 | Discharge: 2021-07-13 | Disposition: A | Discharge status: Home / Self Care | Payer: 59 | Source: Ambulatory Visit | Attending: Internal Medicine | Admitting: Internal Medicine

## 2021-07-13 DIAGNOSIS — M79641 Pain in right hand: Secondary | ICD-10-CM

## 2021-07-27 ENCOUNTER — Telehealth: Payer: Self-pay

## 2021-07-27 NOTE — Telephone Encounter (Signed)
NOTES SCANNED TO REFERRAL 

## 2021-08-04 ENCOUNTER — Other Ambulatory Visit: Payer: Self-pay | Admitting: Gastroenterology

## 2021-08-08 NOTE — Progress Notes (Deleted)
No chief complaint on file.     History of Present Illness: 61 yo male with history of BPH, pituitary tumor and sleep apnea who is here today as a new patient for the evaluation of chest pain. *** ? Notes from [facility]  Primary Care Physician: Lavone Orn, MD   Past Medical History:  Diagnosis Date   Abnormal EKG    Acne    BPH (benign prostatic hyperplasia)    Chest pain    Fatty liver    Finger pain    Hyperprolactinemia ([place])    Infertility male    Morbid obesity ([place])    Neoplasm of pituitary gland    Pituitary tumor    Pseudogynecomastia    Sleep apnea     Past Surgical History:  Procedure Laterality Date   ACHILLES TENDON REPAIR     CHOLECYSTECTOMY N/A 11/20/2014   Procedure: LAPAROSCOPIC CHOLECYSTECTOMY ;  Surgeon: Erroll Luna, MD;  Location: [facility];  Service: General;  Laterality: N/A;    Current Outpatient Medications  Medication Sig Dispense Refill   acetaminophen (TYLENOL) 500 MG tablet Take 1,000 mg by mouth every 6 (six) hours as needed for mild pain or moderate pain.     amoxicillin-clavulanate (AUGMENTIN) 875-125 MG per tablet Take 1 tablet by mouth 2 (two) times daily. (Patient not taking: Reported on 04/11/2019) 6 tablet 0   azithromycin (ZITHROMAX) 500 MG tablet Take 1,000 mg by mouth once.     cabergoline (DOSTINEX) 0.5 MG tablet Take 0.25 mg by mouth 2 (two) times a week.      calcium carbonate (TUMS - DOSED IN MG ELEMENTAL CALCIUM) 500 MG chewable tablet Chew 1 tablet by mouth daily as needed for indigestion or heartburn.     ciprofloxacin (CIPRO) 500 MG tablet Take 1 tablet (500 mg total) by mouth 2 (two) times daily. One po bid x 7 days 14 tablet 0   doxycycline (VIBRAMYCIN) 100 MG capsule Take 100 mg by mouth 2 (two) times daily. Started 12.30.2020 will end 1.6.2021     HYDROmorphone (DILAUDID) 2 MG tablet Take 1-2 tablets (2-4 mg total) by mouth every 4 (four) hours as needed for severe pain. (Patient not taking: Reported on 04/11/2019) 40  tablet 0   Multiple Vitamins-Minerals (MULTIVITAMIN & MINERAL PO) Take 1 tablet by mouth daily.     phenazopyridine (PYRIDIUM) 200 MG tablet Take 200 mg by mouth 3 (three) times daily.     Simethicone (GAS-X PO) Take 1 tablet by mouth daily as needed (flatuelence).     No current facility-administered medications for this visit.    Allergies  Allergen Reactions   Prednisone     SHORT TEMPERED    Tamsulosin     SINUS ISSUES    Social History   Socioeconomic History   Marital status: Married    Spouse name: Not on file   Number of children: Not on file   Years of education: Not on file   Highest education level: Not on file  Occupational History   Not on file  Tobacco Use   Smoking status: Never   Smokeless tobacco: Never  Substance and Sexual Activity   Alcohol use: No   Drug use: No   Sexual activity: Not on file  Other Topics Concern   Not on file  Social History Narrative   Not on file   Social Determinants of Health   Financial Resource Strain: Not on file  Food Insecurity: Not on file  Transportation Needs: Not  on file  Physical Activity: Not on file  Stress: Not on file  Social Connections: Not on file  Intimate Partner Violence: Not on file    No family history on file.  Review of Systems:  As stated in the HPI and otherwise negative.   There were no vitals taken for this visit.  Physical Examination: General: Well developed, well nourished, NAD  HEENT: OP clear, mucus membranes moist  SKIN: warm, dry. No rashes. Neuro: No focal deficits  Musculoskeletal: Muscle strength 5/5 all ext  Psychiatric: Mood and affect normal  Neck: No JVD, no carotid bruits, no thyromegaly, no lymphadenopathy.  Lungs:Clear bilaterally, no wheezes, rhonci, crackles Cardiovascular: Regular rate and rhythm. No murmurs, gallops or rubs. Abdomen:Soft. Bowel sounds present. Non-tender.  Extremities: No lower extremity edema. Pulses are 2 + in the bilateral DP/PT.  EKG:   EKG {ACTION; IS/IS QMG:[id number]} ordered today. The ekg ordered today demonstrates ***  Recent Labs: No results found for requested labs within last 8760 hours.   Lipid Panel No results found for: CHOL, TRIG, HDL, CHOLHDL, VLDL, LDLCALC, LDLDIRECT   Wt Readings from Last 3 Encounters:  04/11/19 (!) 312 lb (141.5 kg)  11/19/14 (!) 312 lb 6.3 oz (141.7 kg)  11/16/14 (!) 315 lb (142.9 kg)     Other studies Reviewed: Additional studies/ records that were reviewed today include: ***. Review of the above records demonstrates: ***   Assessment and Plan:   1.   Current medicines are reviewed at length with the patient today.  The patient {ACTIONS; HAS/DOES NOT HAVE:19233} concerns regarding medicines.  The following changes have been made:  {PLAN; NO CHANGE:13088:s}  Labs/ tests ordered today include: *** No orders of the defined types were placed in this encounter.    Disposition:   F/U with *** in {gen number 8-89:[id number]} {TIME; UNITS DAY/WEEK/MONTH:19136}   Signed, Lauree Chandler, MD 08/08/2021 5:52 PM    [facility] [address], [place], [place]  [address] Phone: [phone]; Fax: [phone]

## 2021-08-09 ENCOUNTER — Ambulatory Visit: Payer: 59 | Admitting: Cardiovascular Disease

## 2021-08-10 ENCOUNTER — Encounter (HOSPITAL_COMMUNITY): Payer: Self-pay | Admitting: Internal Medicine

## 2021-08-10 ENCOUNTER — Other Ambulatory Visit (HOSPITAL_COMMUNITY): Payer: Self-pay | Admitting: Internal Medicine

## 2021-08-10 DIAGNOSIS — R079 Chest pain, unspecified: Secondary | ICD-10-CM

## 2021-08-13 ENCOUNTER — Telehealth (HOSPITAL_COMMUNITY): Payer: Self-pay | Admitting: *Deleted

## 2021-08-13 NOTE — Telephone Encounter (Signed)
Close encounter 

## 2021-08-17 ENCOUNTER — Ambulatory Visit (HOSPITAL_COMMUNITY)
Admission: RE | Admit: 2021-08-17 | Discharge: 2021-08-17 | Disposition: A | Discharge status: Home / Self Care | Payer: 59 | Source: Ambulatory Visit | Attending: Cardiology | Admitting: Cardiology

## 2021-08-17 DIAGNOSIS — R079 Chest pain, unspecified: Secondary | ICD-10-CM

## 2021-08-18 ENCOUNTER — Ambulatory Visit (HOSPITAL_COMMUNITY): Payer: 59

## 2021-08-20 ENCOUNTER — Telehealth (HOSPITAL_COMMUNITY): Payer: Self-pay | Admitting: *Deleted

## 2021-08-20 NOTE — Telephone Encounter (Signed)
Close encounter 

## 2021-08-24 ENCOUNTER — Ambulatory Visit (HOSPITAL_COMMUNITY)
Admission: RE | Admit: 2021-08-24 | Discharge: 2021-08-24 | Disposition: A | Discharge status: Home / Self Care | Payer: 59 | Source: Ambulatory Visit | Attending: Cardiology | Admitting: Cardiology

## 2021-08-24 ENCOUNTER — Other Ambulatory Visit: Payer: Self-pay | Admitting: *Deleted

## 2021-08-24 DIAGNOSIS — R079 Chest pain, unspecified: Secondary | ICD-10-CM | POA: Diagnosis not present

## 2021-08-24 LAB — MYOCARDIAL PERFUSION IMAGING
Estimated workload: 8.4
Exercise duration (min): 7 min
Exercise duration (sec): 50 s
MPHR: 160 {beats}/min
Peak HR: 142 {beats}/min
Percent HR: 88 %
Rest HR: 65 {beats}/min
Stress Nuclear Isotope Dose: 28.5 mCi

## 2021-08-24 MED ORDER — TECHNETIUM TC 99M TETROFOSMIN IV KIT
28.5000 | PACK | Freq: Once | INTRAVENOUS | Status: AC | PRN
  Administered 2021-08-24: 28.5 via INTRAVENOUS

## 2021-08-26 ENCOUNTER — Ambulatory Visit (HOSPITAL_COMMUNITY)
Admission: RE | Admit: 2021-08-26 | Discharge: 2021-08-26 | Disposition: A | Discharge status: Home / Self Care | Payer: 59 | Source: Ambulatory Visit | Attending: Internal Medicine | Admitting: Internal Medicine

## 2021-08-26 LAB — MYOCARDIAL PERFUSION IMAGING
Angina Index: 0
LV dias vol: 162 mL (ref 62–150)
LV sys vol: 83 mL
Nuc Stress EF: 49 %
SDS: 2
SRS: 3
SSS: 5
ST Depression (mm): 0 mm
Stress Nuclear Isotope Dose: 28.5 mCi
TID: 0.9

## 2021-08-26 MED ORDER — TECHNETIUM TC 99M TETROFOSMIN IV KIT
31.6000 | PACK | Freq: Once | INTRAVENOUS | Status: AC | PRN
  Administered 2021-08-26: 31.6 via INTRAVENOUS

## 2021-10-05 ENCOUNTER — Ambulatory Visit: Payer: 59 | Admitting: Internal Medicine

## 2021-10-05 ENCOUNTER — Encounter: Payer: Self-pay | Admitting: Internal Medicine

## 2021-10-05 VITALS — BP 138/72 | HR 81 | Ht 67.0 in | Wt 331.0 lb

## 2021-10-05 DIAGNOSIS — R079 Chest pain, unspecified: Secondary | ICD-10-CM

## 2021-10-26 ENCOUNTER — Ambulatory Visit (HOSPITAL_COMMUNITY): Payer: 59

## 2021-11-02 ENCOUNTER — Ambulatory Visit (HOSPITAL_COMMUNITY)
Admission: RE | Admit: 2021-11-02 | Discharge: 2021-11-02 | Disposition: A | Discharge status: Home / Self Care | Payer: 59 | Source: Ambulatory Visit | Attending: Internal Medicine | Admitting: Internal Medicine

## 2021-11-02 DIAGNOSIS — R079 Chest pain, unspecified: Secondary | ICD-10-CM

## 2021-11-30 ENCOUNTER — Encounter (HOSPITAL_COMMUNITY): Payer: Self-pay | Admitting: Gastroenterology

## 2021-11-30 NOTE — Progress Notes (Signed)
Attempted to obtain medical history via telephone, unable to reach at this time. HIPAA compliant voicemail message left requesting return call to pre surgical testing department. 

## 2021-12-07 ENCOUNTER — Ambulatory Visit (HOSPITAL_BASED_OUTPATIENT_CLINIC_OR_DEPARTMENT_OTHER): Payer: 59 | Admitting: Certified Registered Nurse Anesthetist

## 2021-12-07 ENCOUNTER — Ambulatory Visit (HOSPITAL_COMMUNITY): Payer: 59 | Admitting: Certified Registered Nurse Anesthetist

## 2021-12-07 ENCOUNTER — Other Ambulatory Visit: Payer: Self-pay

## 2021-12-07 ENCOUNTER — Ambulatory Visit (HOSPITAL_COMMUNITY)
Admission: RE | Admit: 2021-12-07 | Discharge: 2021-12-07 | Disposition: A | Discharge status: Home / Self Care | Payer: 59 | Attending: Gastroenterology | Admitting: Gastroenterology

## 2021-12-07 ENCOUNTER — Encounter (HOSPITAL_COMMUNITY)
Admission: RE | Disposition: A | Discharge status: Home / Self Care | Payer: Self-pay | Source: Home / Self Care | Attending: Gastroenterology

## 2021-12-07 DIAGNOSIS — G4730 Sleep apnea, unspecified: Secondary | ICD-10-CM | POA: Insufficient documentation

## 2021-12-07 DIAGNOSIS — D122 Benign neoplasm of ascending colon: Secondary | ICD-10-CM | POA: Diagnosis not present

## 2021-12-07 DIAGNOSIS — K648 Other hemorrhoids: Secondary | ICD-10-CM | POA: Diagnosis not present

## 2021-12-07 DIAGNOSIS — Z6841 Body Mass Index (BMI) 40.0 and over, adult: Secondary | ICD-10-CM | POA: Diagnosis not present

## 2021-12-07 DIAGNOSIS — Z1211 Encounter for screening for malignant neoplasm of colon: Secondary | ICD-10-CM | POA: Insufficient documentation

## 2021-12-07 DIAGNOSIS — K644 Residual hemorrhoidal skin tags: Secondary | ICD-10-CM

## 2021-12-07 DIAGNOSIS — K5730 Diverticulosis of large intestine without perforation or abscess without bleeding: Secondary | ICD-10-CM

## 2021-12-07 DIAGNOSIS — K635 Polyp of colon: Secondary | ICD-10-CM | POA: Diagnosis not present

## 2021-12-07 HISTORY — PX: POLYPECTOMY: SHX5525

## 2021-12-07 HISTORY — PX: COLONOSCOPY WITH PROPOFOL: SHX5780

## 2021-12-07 SURGERY — COLONOSCOPY WITH PROPOFOL
Anesthesia: Monitor Anesthesia Care

## 2021-12-07 MED ORDER — PROPOFOL 10 MG/ML IV BOLUS
INTRAVENOUS | Status: DC | PRN
  Administered 2021-12-07: 20 mg via INTRAVENOUS

## 2021-12-07 MED ORDER — LACTATED RINGERS IV SOLN: INTRAVENOUS | Status: DC

## 2021-12-07 MED ORDER — SODIUM CHLORIDE 0.9 % IV SOLN: INTRAVENOUS | Status: DC

## 2021-12-07 MED ORDER — PROPOFOL 500 MG/50ML IV EMUL
INTRAVENOUS | Status: DC | PRN
  Administered 2021-12-07: 125 ug/kg/min via INTRAVENOUS

## 2021-12-07 MED ORDER — ONDANSETRON HCL 4 MG/2ML IJ SOLN
INTRAMUSCULAR | Status: DC | PRN
  Administered 2021-12-07: 4 mg via INTRAVENOUS

## 2021-12-07 MED ORDER — PROPOFOL 1000 MG/100ML IV EMUL
INTRAVENOUS | Status: AC
  Filled 2021-12-07: qty 100

## 2021-12-07 SURGICAL SUPPLY — 22 items

## 2021-12-07 NOTE — Anesthesia Preprocedure Evaluation (Signed)
Anesthesia Evaluation  Patient identified by MRN, date of birth, ID band Patient awake    Reviewed: Allergy & Precautions, NPO status , Patient's Chart, lab work & pertinent test results  Airway Mallampati: I       Dental  (+) Caps,    Pulmonary sleep apnea ,    Pulmonary exam normal        Cardiovascular negative cardio ROS Normal cardiovascular exam     Neuro/Psych negative neurological ROS  negative psych ROS   GI/Hepatic negative GI ROS, Neg liver ROS,   Endo/Other  Morbid obesity  Renal/GU negative Renal ROS  negative genitourinary   Musculoskeletal negative musculoskeletal ROS (+)   Abdominal (+) + obese,   Peds  Hematology   Anesthesia Other Findings   Reproductive/Obstetrics                             Anesthesia Physical Anesthesia Plan  ASA: 3  Anesthesia Plan: MAC   Post-op Pain Management:    Induction:   PONV Risk Score and Plan: Propofol infusion and TIVA  Airway Management Planned: Natural Airway and Simple Face Mask  Additional Equipment:   Intra-op Plan:   Post-operative Plan:   Informed Consent: I have reviewed the patients History and Physical, chart, labs and discussed the procedure including the risks, benefits and alternatives for the proposed anesthesia with the patient or authorized representative who has indicated his/her understanding and acceptance.       Plan Discussed with: CRNA  Anesthesia Plan Comments:         Anesthesia Quick Evaluation

## 2021-12-07 NOTE — Discharge Instructions (Signed)
YOU HAD AN ENDOSCOPIC PROCEDURE TODAY: Refer to the procedure report and other information in the discharge instructions given to you for any specific questions about what was found during the examination. If this information does not answer your questions, please call [facility] office at [phone] to clarify.   YOU SHOULD EXPECT: Some feelings of bloating in the abdomen. Passage of more gas than usual. Walking can help get rid of the air that was put into your GI tract during the procedure and reduce the bloating. If you had a lower endoscopy (such as a colonoscopy or flexible sigmoidoscopy) you may notice spotting of blood in your stool or on the toilet paper. Some abdominal soreness may be present for a day or two, also.  DIET: Your first meal following the procedure should be a light meal and then it is ok to progress to your normal diet. A half-sandwich or bowl of soup is an example of a good first meal. Heavy or fried foods are harder to digest and may make you feel nauseous or bloated. Drink plenty of fluids but you should avoid alcoholic beverages for 24 hours. If you had a esophageal dilation, please see attached instructions for diet.    ACTIVITY: Your care partner should take you home directly after the procedure. You should plan to take it easy, moving slowly for the rest of the day. You can resume normal activity the day after the procedure however YOU SHOULD NOT DRIVE, use power tools, machinery or perform tasks that involve climbing or major physical exertion for 24 hours (because of the sedation medicines used during the test).   SYMPTOMS TO REPORT IMMEDIATELY: A gastroenterologist can be reached at any hour. Please call [phone]  for any of the following symptoms:  Following lower endoscopy (colonoscopy, flexible sigmoidoscopy) Excessive amounts of blood in the stool  Significant tenderness, worsening of abdominal pains  Swelling of the abdomen that is new, acute  Fever of 100  or higher   FOLLOW UP:  If any biopsies were taken you will be contacted by phone or by letter within the next 1-3 weeks. Call [phone]  if you have not heard about the biopsies in 3 weeks.  Please also call with any specific questions about appointments or follow up tests. YOU HAD AN ENDOSCOPIC PROCEDURE TODAY: Refer to the procedure report and other information in the discharge instructions given to you for any specific questions about what was found during the examination. If this information does not answer your questions, please call [facility] office at [phone] to clarify.  

## 2021-12-07 NOTE — Anesthesia Procedure Notes (Signed)
Procedure Name: MAC Date/Time: 12/07/2021 10:36 AM  Performed by: Niel Hummer, CRNAPre-anesthesia Checklist: Patient identified, Emergency Drugs available, Suction available and Patient being monitored Oxygen Delivery Method: Simple face mask

## 2021-12-07 NOTE — Anesthesia Postprocedure Evaluation (Signed)
Anesthesia Post Note  Patient: Wesley Little  Procedure(s) Performed: COLONOSCOPY WITH PROPOFOL POLYPECTOMY     Patient location during evaluation: Endoscopy Anesthesia Type: MAC Level of consciousness: awake Pain management: pain level controlled Vital Signs Assessment: post-procedure vital signs reviewed and stable Respiratory status: spontaneous breathing Cardiovascular status: stable Postop Assessment: no apparent nausea or vomiting Anesthetic complications: no   No notable events documented.  Last Vitals:  Vitals:   12/07/21 1110 12/07/21 1120  BP: 118/68 133/81  Pulse: 71 64  Resp: (!) 25 20  Temp:    SpO2: 98% 95%    Last Pain:  Vitals:   12/07/21 1110  TempSrc:   PainSc: 0-No pain                 Huston Foley

## 2021-12-07 NOTE — H&P (Signed)
Primary Care Physician:  Lavone Orn, MD Primary Gastroenterologist:  [facility]  Reason for Visit : Outpatient screening colonoscopy  HPI: Wesley Little is a 61 y.o. male with past medical history of morbid obesity and sleep apnea here for outpatient screening colonoscopy.  Last colonoscopy more than 10 years ago was normal.  No family history of colon cancer.  Patient denies any GI symptoms.  Past Medical History:  Diagnosis Date   Abnormal EKG    Acne    BPH (benign prostatic hyperplasia)    Chest pain    Fatty liver    Finger pain    Hyperprolactinemia ([facility])    Infertility male    Morbid obesity ([place])    Neoplasm of pituitary gland    Pituitary tumor    Pseudogynecomastia    Sleep apnea     Past Surgical History:  Procedure Laterality Date   ACHILLES TENDON REPAIR     CHOLECYSTECTOMY N/A 11/20/2014   Procedure: LAPAROSCOPIC CHOLECYSTECTOMY ;  Surgeon: Erroll Luna, MD;  Location: [facility];  Service: General;  Laterality: N/A;    Prior to Admission medications   Medication Sig Start Date End Date Taking? Authorizing Provider  alfuzosin (UROXATRAL) 10 MG 24 hr tablet Take 10 mg by mouth daily.   Yes [provider]  Ascorbic Acid (VITAMIN C) 1000 MG tablet Take 1,000 mg by mouth daily.   Yes [provider]  aspirin-acetaminophen-caffeine (EXCEDRIN EXTRA STRENGTH) [phone] MG tablet Take 2 tablets by mouth every 6 (six) hours as needed for headache.   Yes [provider]  cabergoline (DOSTINEX) 0.5 MG tablet Take 0.25 mg by mouth 2 (two) times a week.  10/16/14  Yes [provider]  calcium carbonate (TUMS - DOSED IN MG ELEMENTAL CALCIUM) 500 MG chewable tablet Chew 1 tablet by mouth daily as needed for indigestion or heartburn.   Yes [provider]  cholecalciferol (VITAMIN D3) 25 MCG (1000 UNIT) tablet Take 1,000 Units by mouth daily.   Yes [provider]  cyanocobalamin (VITAMIN B12) 1000 MCG tablet Take 1,000 mcg  by mouth daily.   Yes [provider]  Dextromethorphan-guaiFENesin (MUCUS RELIEF DM) 20-400 MG TABS Take 1 tablet by mouth 2 (two) times daily as needed (congestion).   Yes [provider]  furosemide (LASIX) 40 MG tablet Take 40 mg by mouth daily as needed for edema. 09/11/21  Yes [provider]  KLOR-CON M10 10 MEQ tablet Take 10 mEq by mouth daily as needed (Take with Lasix). 09/11/21  Yes [provider]  Multiple Vitamins-Minerals (MULTIVITAMIN & MINERAL PO) Take 1 tablet by mouth daily.   Yes [provider]  phenylephrine (SUDAFED PE) 10 MG TABS tablet Take 10 mg by mouth every 4 (four) hours as needed (nasal congestion).   Yes [provider]  pseudoephedrine-guaifenesin (MUCINEX D) 60-600 MG 12 hr tablet Take 1 tablet by mouth 2 (two) times daily as needed for congestion.   Yes [provider]  sodium chloride (OCEAN) 0.65 % SOLN nasal spray Place 1 spray into both nostrils as needed for congestion.   Yes [provider]  diclofenac Sodium (VOLTAREN) 1 % GEL Apply 1 Application topically daily as needed (pain).    [provider]    Scheduled Meds: Continuous Infusions:  sodium chloride     PRN Meds:.  Allergies as of 08/04/2021   (No Known Allergies)    History reviewed. No pertinent family history.  Social History   Socioeconomic History  Marital status: Married    Spouse name: Not on file   Number of children: Not on file   Years of education: Not on file   Highest education level: Not on file  Occupational History   Not on file  Tobacco Use   Smoking status: Never   Smokeless tobacco: Never  Substance and Sexual Activity   Alcohol use: No   Drug use: No   Sexual activity: Not on file  Other Topics Concern   Not on file  Social History Narrative   Not on file   Social Determinants of Health   Financial Resource Strain: Not on file  Food Insecurity: Not on file  Transportation  Needs: Not on file  Physical Activity: Not on file  Stress: Not on file  Social Connections: Not on file  Intimate Partner Violence: Not on file     Physical Exam: Vital signs: There were no vitals filed for this visit.   General:   Alert,  Well-developed, well-nourished, pleasant and cooperative in NAD Lungs: No visible respiratory distress, anterior exam only Heart:  Regular rate and rhythm; no murmurs, clicks, rubs,  or gallops. Abdomen: Soft, nontender, nondistended, bowel sounds present, no peritoneal signs Rectal:  Deferred  GI:  Lab Results: No results for input(s): "WBC", "HGB", "HCT", "PLT" in the last 72 hours. BMET No results for input(s): "NA", "K", "CL", "CO2", "GLUCOSE", "BUN", "CREATININE", "CALCIUM" in the last 72 hours. LFT No results for input(s): "PROT", "ALBUMIN", "AST", "ALT", "ALKPHOS", "BILITOT", "BILIDIR", "IBILI" in the last 72 hours. PT/INR No results for input(s): "LABPROT", "INR" in the last 72 hours.   Studies/Results: No results found.  Impression/Plan: -Colon cancer screening -Morbid obesity  Recommendations ------------------------ -Proceed with colonoscopy today.  Risks (bleeding, infection, bowel perforation that could require surgery, sedation-related changes in cardiopulmonary systems), benefits (identification and possible treatment of source of symptoms, exclusion of certain causes of symptoms), and alternatives (watchful waiting, radiographic imaging studies, empiric medical treatment)  were explained to patient/family in detail and patient wishes to proceed.     LOS: 0 days   Otis Brace  MD, FACP 12/07/2021, 10:10 AM  Contact #  [phone]

## 2021-12-07 NOTE — Op Note (Signed)
[facility] Patient Name: Wesley Little Procedure Date: 12/07/2021 MRN: [id number] Attending MD: Otis Brace , MD Date of Birth: Aug 25, 1960 CSN: [id number] Age: 61 Admit Type: Outpatient Procedure:                Colonoscopy Indications:              Screening for colorectal malignant neoplasm, Last                            colonoscopy 10 years ago Providers:                Otis Brace, MD, Ladoris Gene, RN, Faustina                            Mbumina, Technician Referring MD:              Medicines:                Sedation Administered by an Anesthesia Professional Complications:            No immediate complications. Estimated Blood Loss:     Estimated blood loss was minimal. Procedure:                Pre-Anesthesia Assessment:                           - Prior to the procedure, a History and Physical                            was performed, and patient medications and                            allergies were reviewed. The patient's tolerance of                            previous anesthesia was also reviewed. The risks                            and benefits of the procedure and the sedation                            options and risks were discussed with the patient.                            All questions were answered, and informed consent                            was obtained. Prior Anticoagulants: The patient has                            taken no previous anticoagulant or antiplatelet                            agents. ASA Grade Assessment: III - A patient with  severe systemic disease. After reviewing the risks                            and benefits, the patient was deemed in                            satisfactory condition to undergo the procedure.                           After obtaining informed consent, the colonoscope                            was passed under direct vision. Throughout the                             procedure, the patient's blood pressure, pulse, and                            oxygen saturations were monitored continuously. The                            [id number]-HQ190L ([id number]) Olympus colonoscope was                            introduced through the anus and advanced to the the                            cecum, identified by appendiceal orifice and                            ileocecal valve. The colonoscopy was performed                            without difficulty. The patient tolerated the                            procedure well. The quality of the bowel                            preparation was fair. Scope In: 10:39:48 AM Scope Out: 10:57:41 AM Scope Withdrawal Time: 0 hours 13 minutes 5 seconds  Total Procedure Duration: 0 hours 17 minutes 53 seconds  Findings:      The perianal and digital rectal examinations were normal.      Two sessile polyps were found in the ascending colon. The polyps were       small in size. These polyps were removed with a cold snare. Resection       and retrieval were complete.      A few diverticula were found in the sigmoid colon.      Internal hemorrhoids were found during retroflexion. The hemorrhoids       were medium-sized. Impression:               - Preparation of the colon was fair.                           -  Two small polyps in the ascending colon, removed                            with a cold snare. Resected and retrieved.                           - Diverticulosis in the sigmoid colon.                           - Internal hemorrhoids. Moderate Sedation:      Moderate (conscious) sedation was personally administered by an       anesthesia professional. The following parameters were monitored: oxygen       saturation, heart rate, blood pressure, and response to care. Recommendation:           - Patient has a contact number available for                            emergencies. The signs and symptoms of potential                             delayed complications were discussed with the                            patient. Return to normal activities tomorrow.                            Written discharge instructions were provided to the                            patient.                           - Resume previous diet.                           - Continue present medications.                           - Await pathology results.                           - Repeat colonoscopy in 5-10 years for surveillance                            based on pathology results.                           - Return to my office PRN. Procedure Code(s):        --- Professional ---                           [phone], Colonoscopy, flexible; with removal of                            tumor(s), polyp(s), or other lesion(s) by snare  technique Diagnosis Code(s):        --- Professional ---                           Z12.11, Encounter for screening for malignant                            neoplasm of colon                           K64.8, Other hemorrhoids                           K63.5, Polyp of colon                           K64.4, Residual hemorrhoidal skin tags                           K57.30, Diverticulosis of large intestine without                            perforation or abscess without bleeding CPT copyright 2019 [employer]. All rights reserved. The codes documented in this report are preliminary and upon coder review may  be revised to meet current compliance requirements. Otis Brace, MD Otis Brace, MD 12/07/2021 11:10:21 AM Number of Addenda: 0

## 2021-12-07 NOTE — Transfer of Care (Signed)
Immediate Anesthesia Transfer of Care Note  Patient: Wesley Little  Procedure(s) Performed: COLONOSCOPY WITH PROPOFOL POLYPECTOMY  Patient Location: PACU  Anesthesia Type:MAC  Level of Consciousness: awake, alert  and oriented  Airway & Oxygen Therapy: Patient Spontanous Breathing and Patient connected to face mask oxygen  Post-op Assessment: Report given to RN, Post -op Vital signs reviewed and stable and Patient moving all extremities X 4  Post vital signs: Reviewed and stable  Last Vitals:  Vitals Value Taken Time  BP 105/59   Temp    Pulse 75 12/07/21 1103  Resp 12   SpO2 97 % 12/07/21 1103  Vitals shown include unvalidated device data.  Last Pain:  Vitals:   12/07/21 1009  TempSrc: Temporal  PainSc: 0-No pain         Complications: No notable events documented.

## 2021-12-08 LAB — SURGICAL PATHOLOGY

## 2021-12-10 ENCOUNTER — Encounter (HOSPITAL_COMMUNITY): Payer: Self-pay | Admitting: Gastroenterology

## 2023-01-30 IMAGING — CT CT ABD-PELV W/O CM
1 of 2 series · 14 of 32 positions shown, 19 images · non-contrast
Comparison: Abdominal ultrasound dated 11/19/2014.

CLINICAL DATA: 60-year-old male with right flank pain.

EXAM:
CT ABDOMEN AND PELVIS WITHOUT CONTRAST
TECHNIQUE: Multidetector CT imaging of the abdomen and pelvis was performed
following the standard protocol without IV contrast.

[Series 2: abd/pelvis w/(date) · axial · 0.88mm/px · z∈[-467,-57]mm · 14 of 92 slices shown, 19 images]
[im 5/92  soft-tissue]
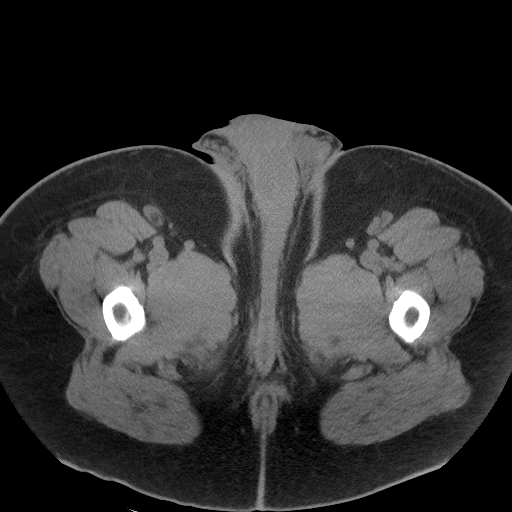
[im 5/92  bone]
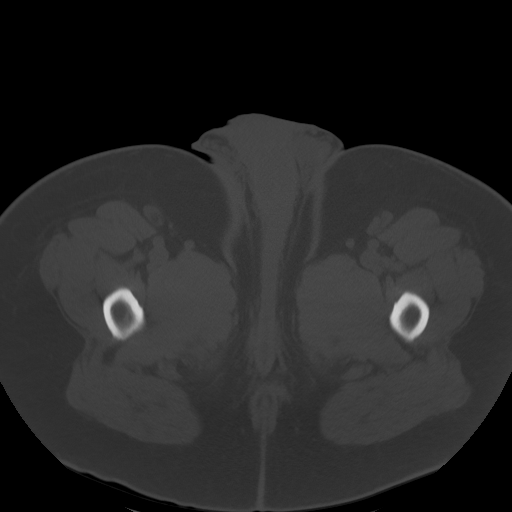
[im 15/92  soft-tissue]
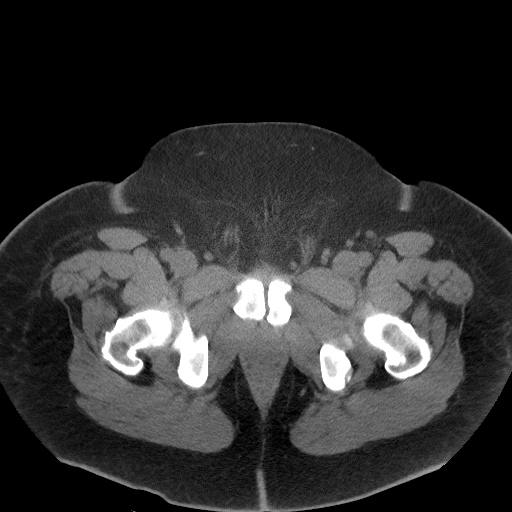
[im 20/92  soft-tissue]
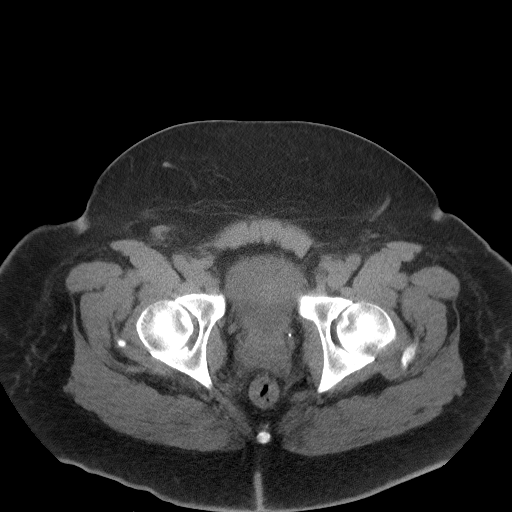
[im 24/92  soft-tissue]
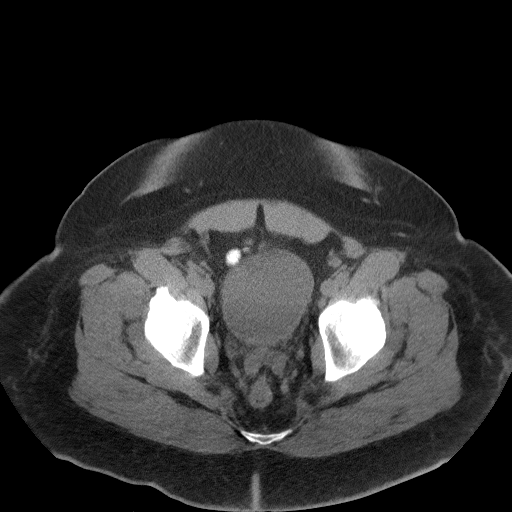
[im 34/92  soft-tissue]
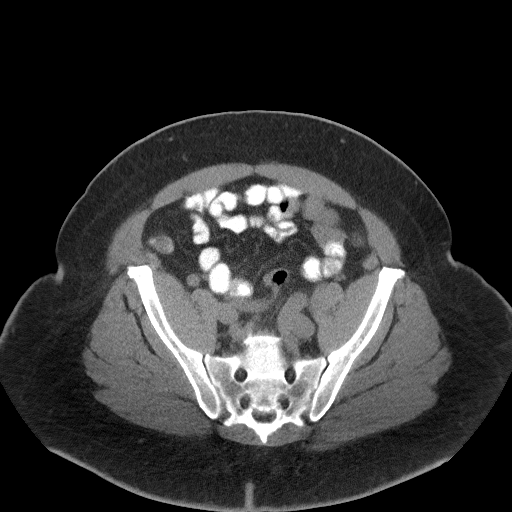
[im 39/92  soft-tissue]
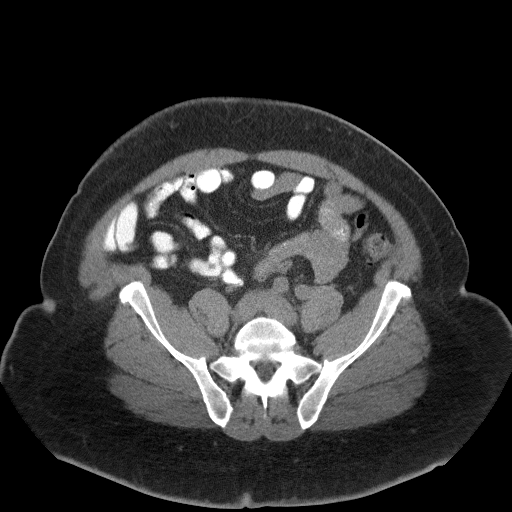
[im 48/92  soft-tissue]
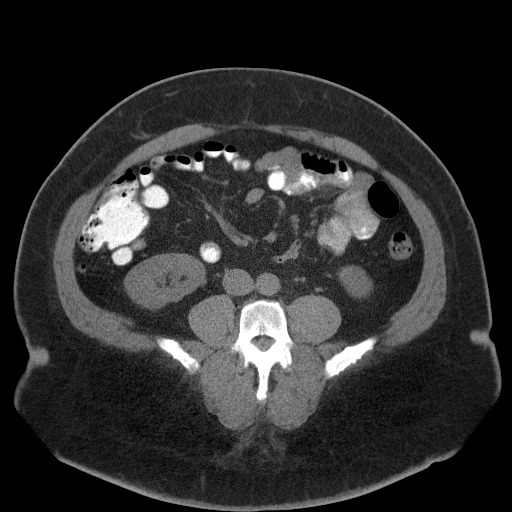
[im 53/92  soft-tissue]
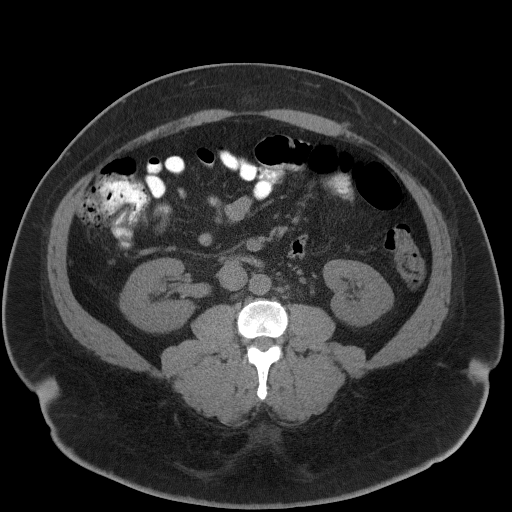
[im 58/92  soft-tissue]
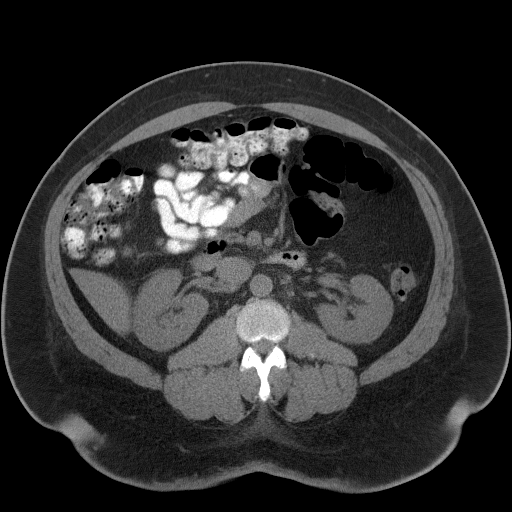
[im 58/92  bone]
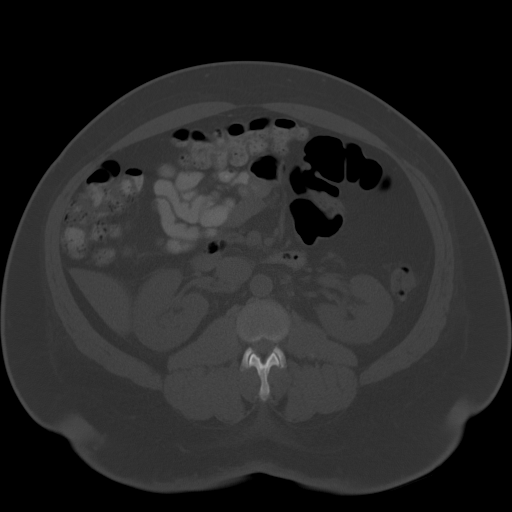
[im 68/92  soft-tissue]
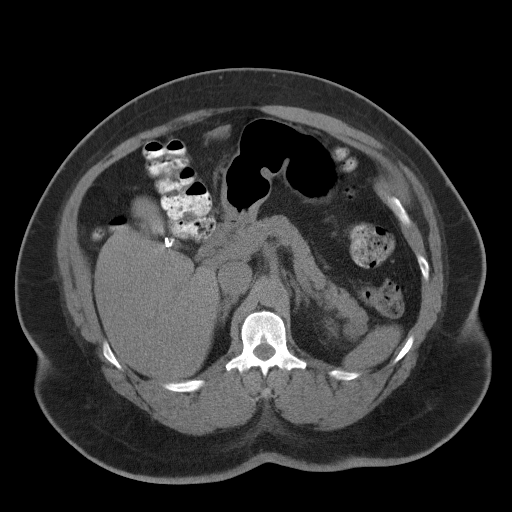
[im 72/92  soft-tissue]
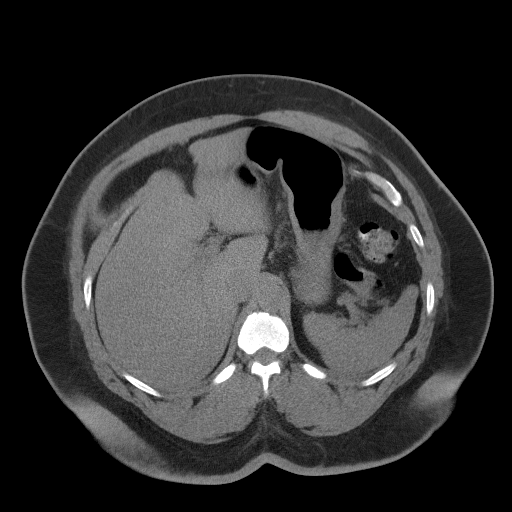
[im 72/92  lung]
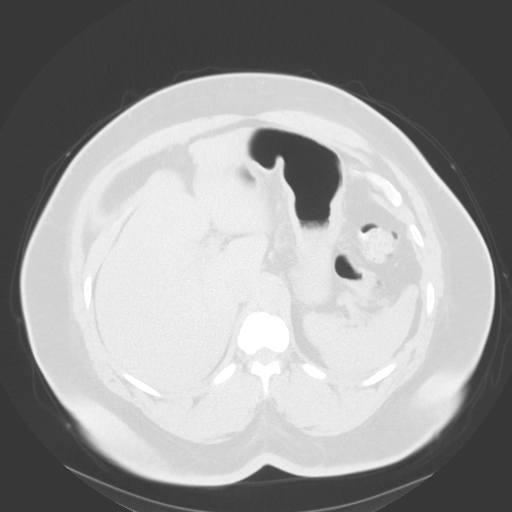
[im 77/92  soft-tissue]
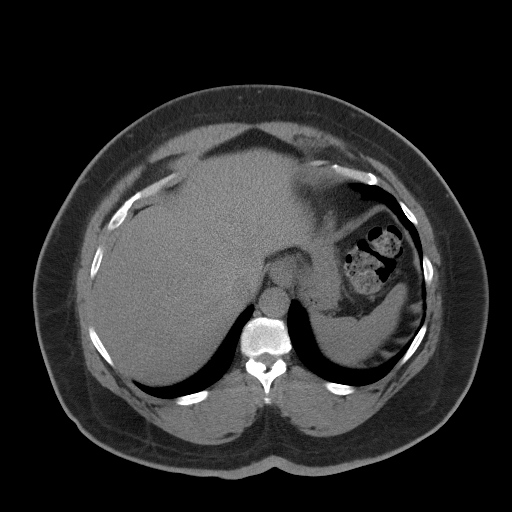
[im 77/92  lung]
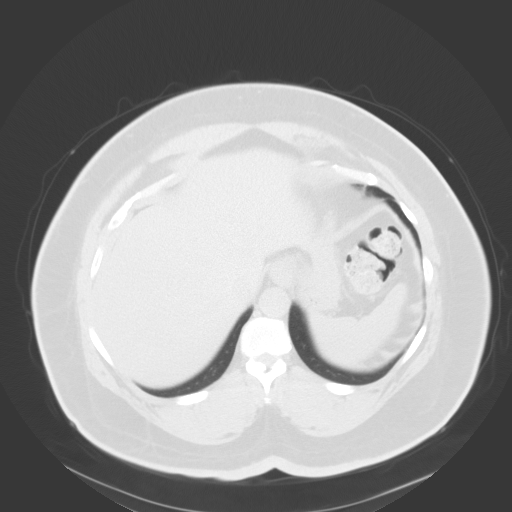
[im 82/92  lung]
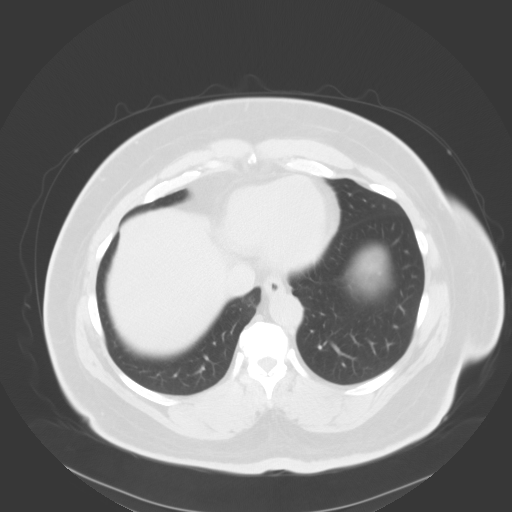
[im 87/92  soft-tissue]
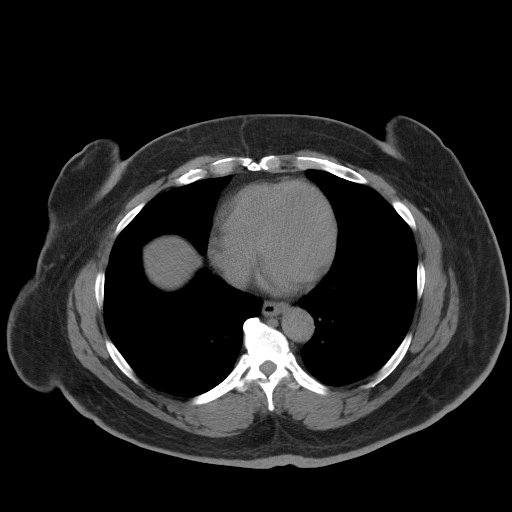
[im 87/92  lung]
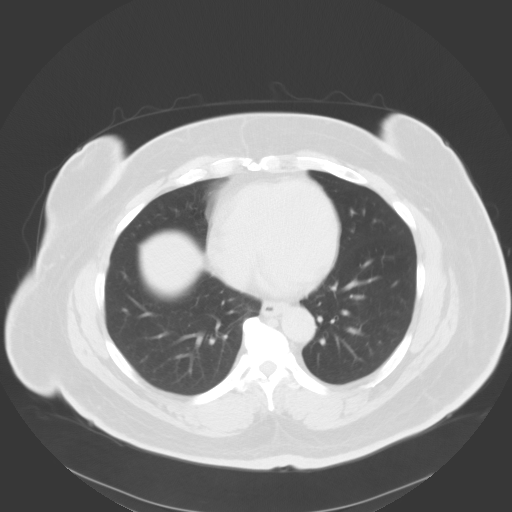

[14 of 32 positions shown; findings below may reference images not displayed]

FINDINGS: Evaluation of this exam is limited in the absence of intravenous
contrast.

Lower chest: The visualized lung bases are clear.

No intra-abdominal free air or free fluid.

Hepatobiliary: Diffuse fatty liver. No intrahepatic biliary
dilatation. Cholecystectomy. No retained calcified stone noted in
the central CBD.

Pancreas: Unremarkable. No pancreatic ductal dilatation or
surrounding inflammatory changes.

Spleen: Normal in size without focal abnormality.

Adrenals/Urinary Tract: The adrenal glands unremarkable. There is no
hydronephrosis or nephrolithiasis on either side. The visualized
ureters and urinary bladder appear unremarkable.

Stomach/Bowel: There is scattered colonic diverticulosis without
active inflammatory changes. There is no bowel obstruction or active
inflammation. The appendix is normal.

Vascular/Lymphatic: The abdominal aorta and IVC are grossly
unremarkable on this noncontrast CT. No portal venous gas. There is
no adenopathy.

Reproductive: The prostate and seminal vesicles are grossly
unremarkable. No pelvic mass.

Other: None

Musculoskeletal: No acute or significant osseous findings.
IMPRESSION: 1. No acute intra-abdominal or pelvic pathology. No hydronephrosis
or nephrolithiasis.
2. Fatty liver.
3. Colonic diverticulosis. No bowel obstruction. Normal appendix.

## 2023-12-30 IMAGING — CR DG HAND COMPLETE 3+V*R*
3 series · 3 of 3 positions shown · non-contrast
Comparison: 10/06/2015

CLINICAL DATA: Joint pain right hand

EXAM:
RIGHT HAND - COMPLETE 3+ VIEW

[x hand pa right]
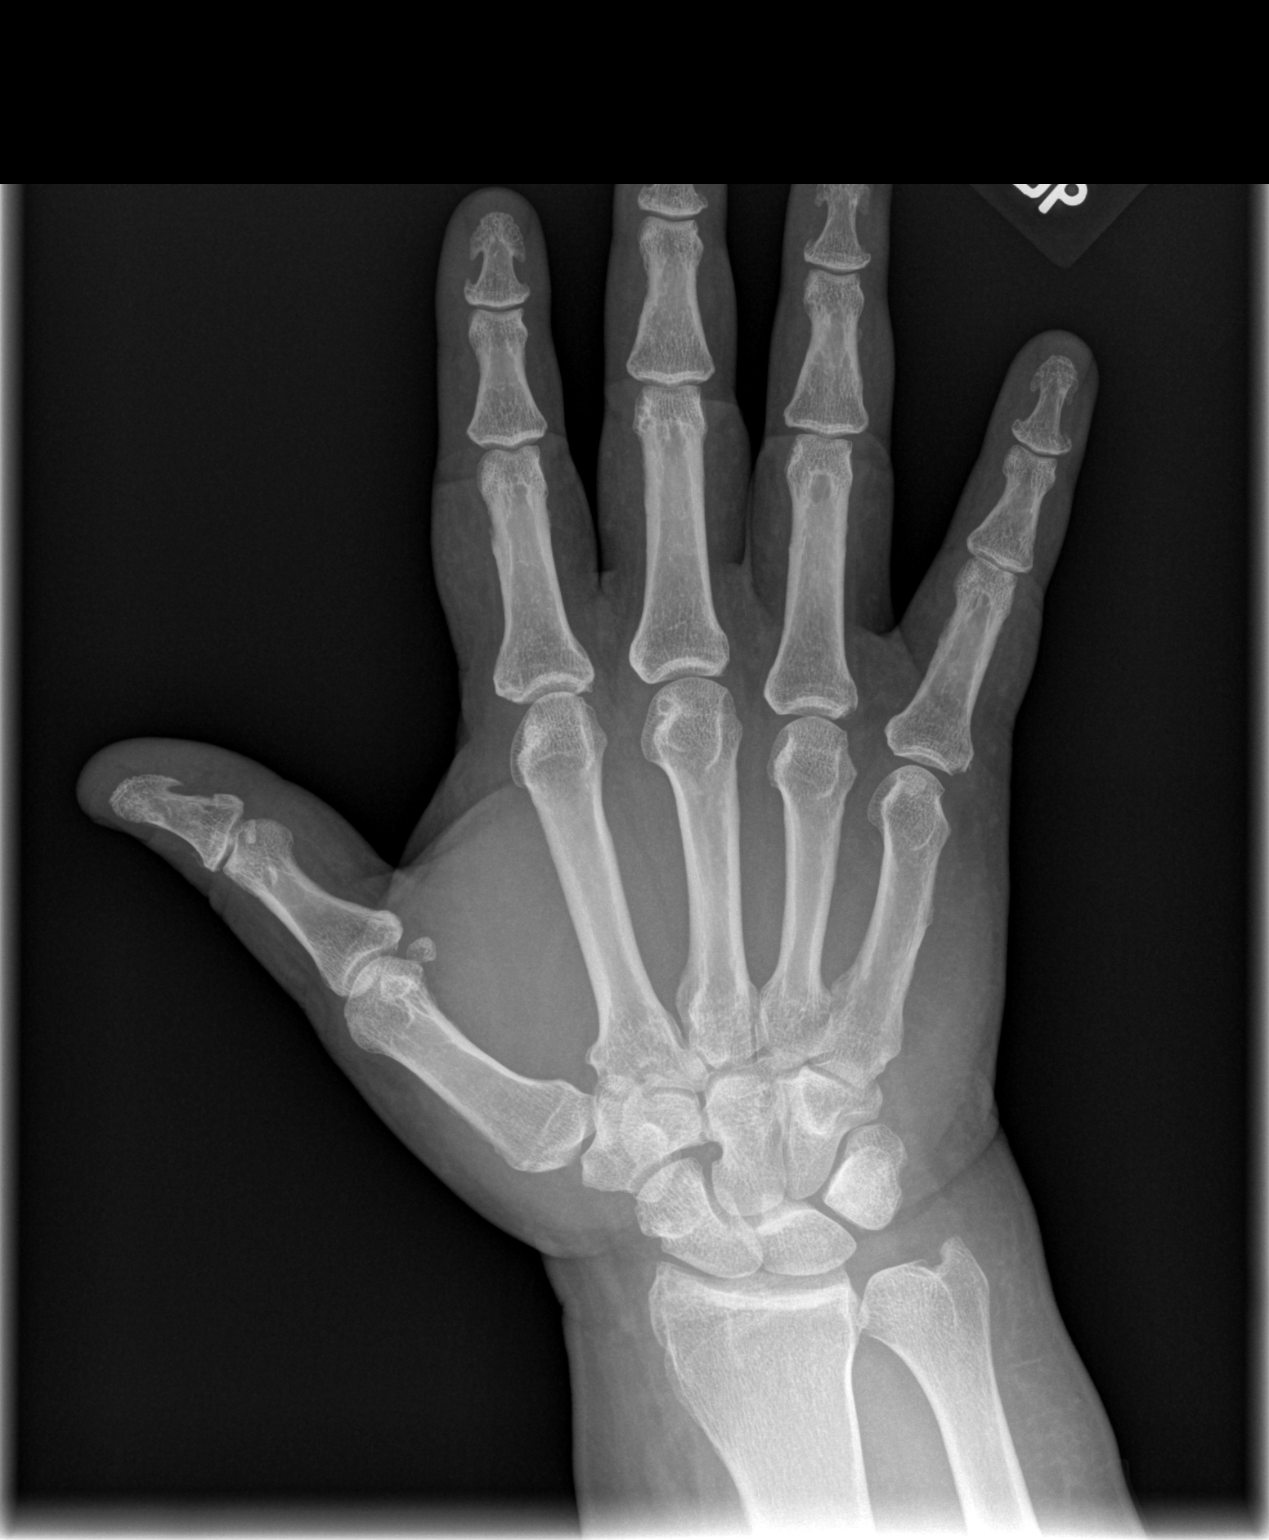

[x hand oblique right]
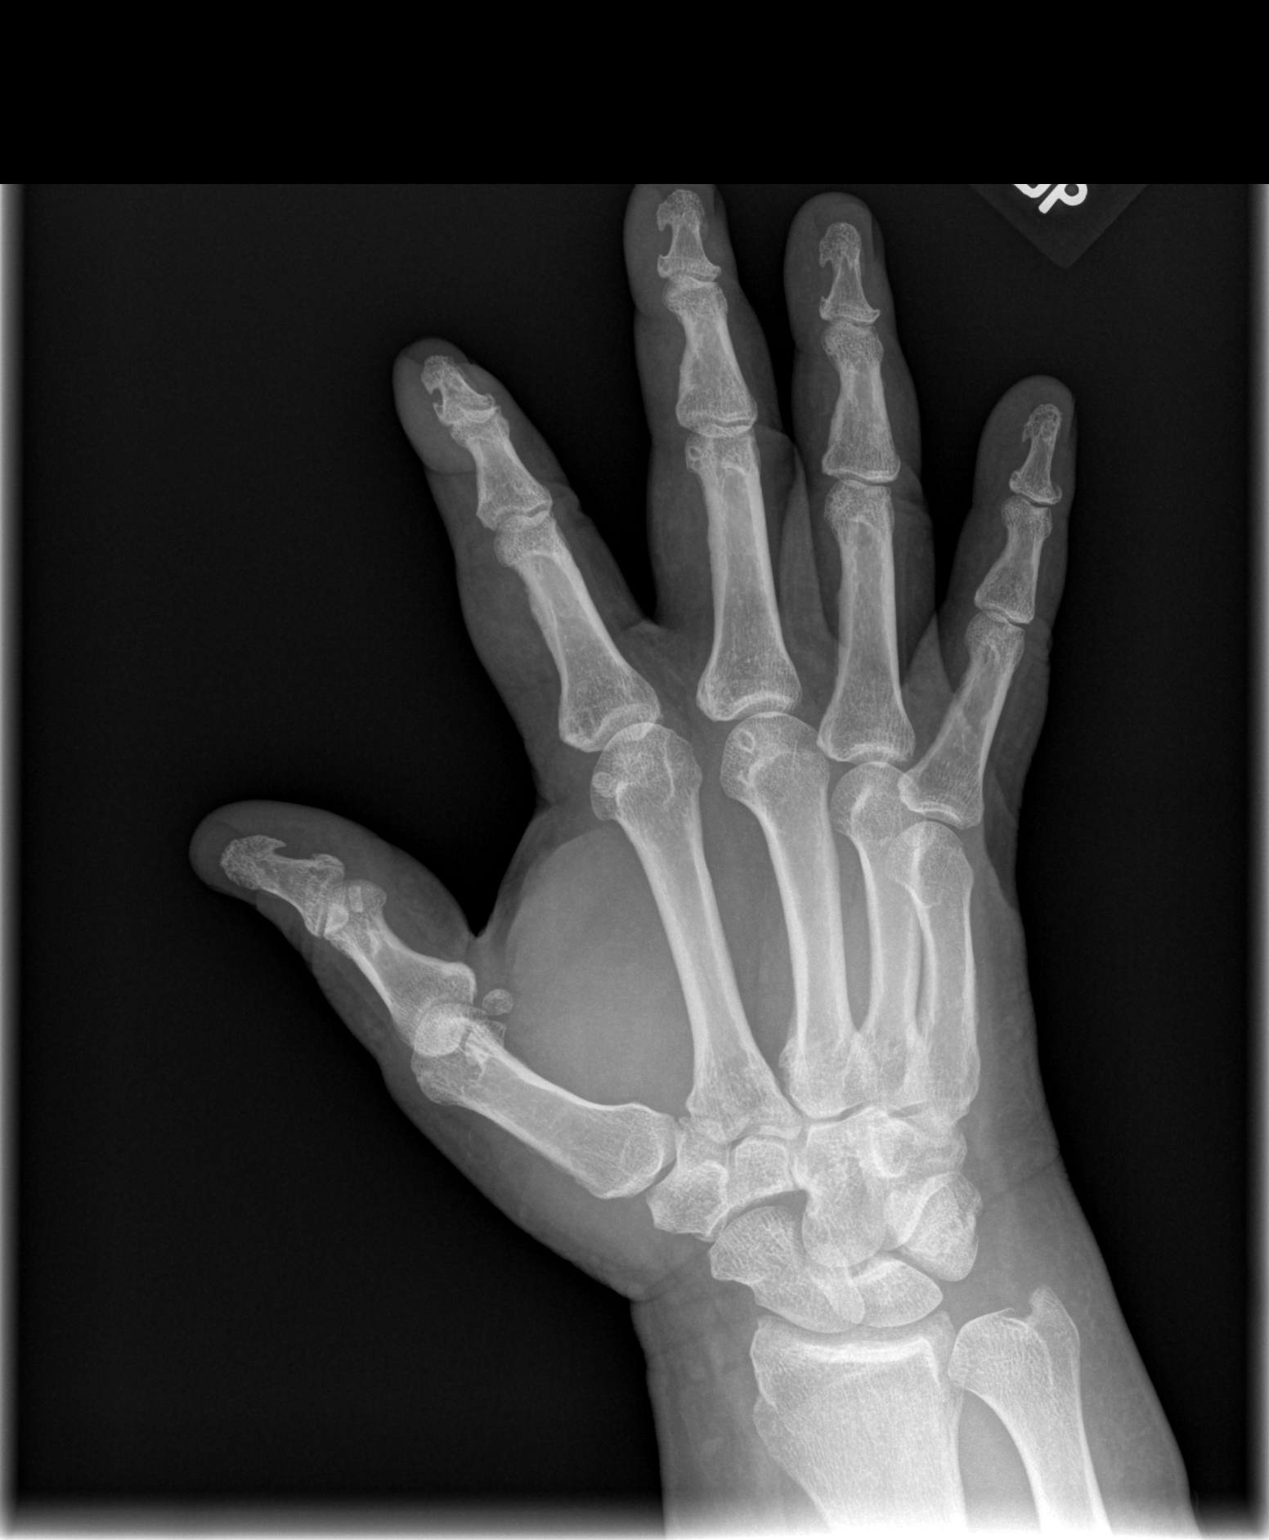

[x hand lat right]
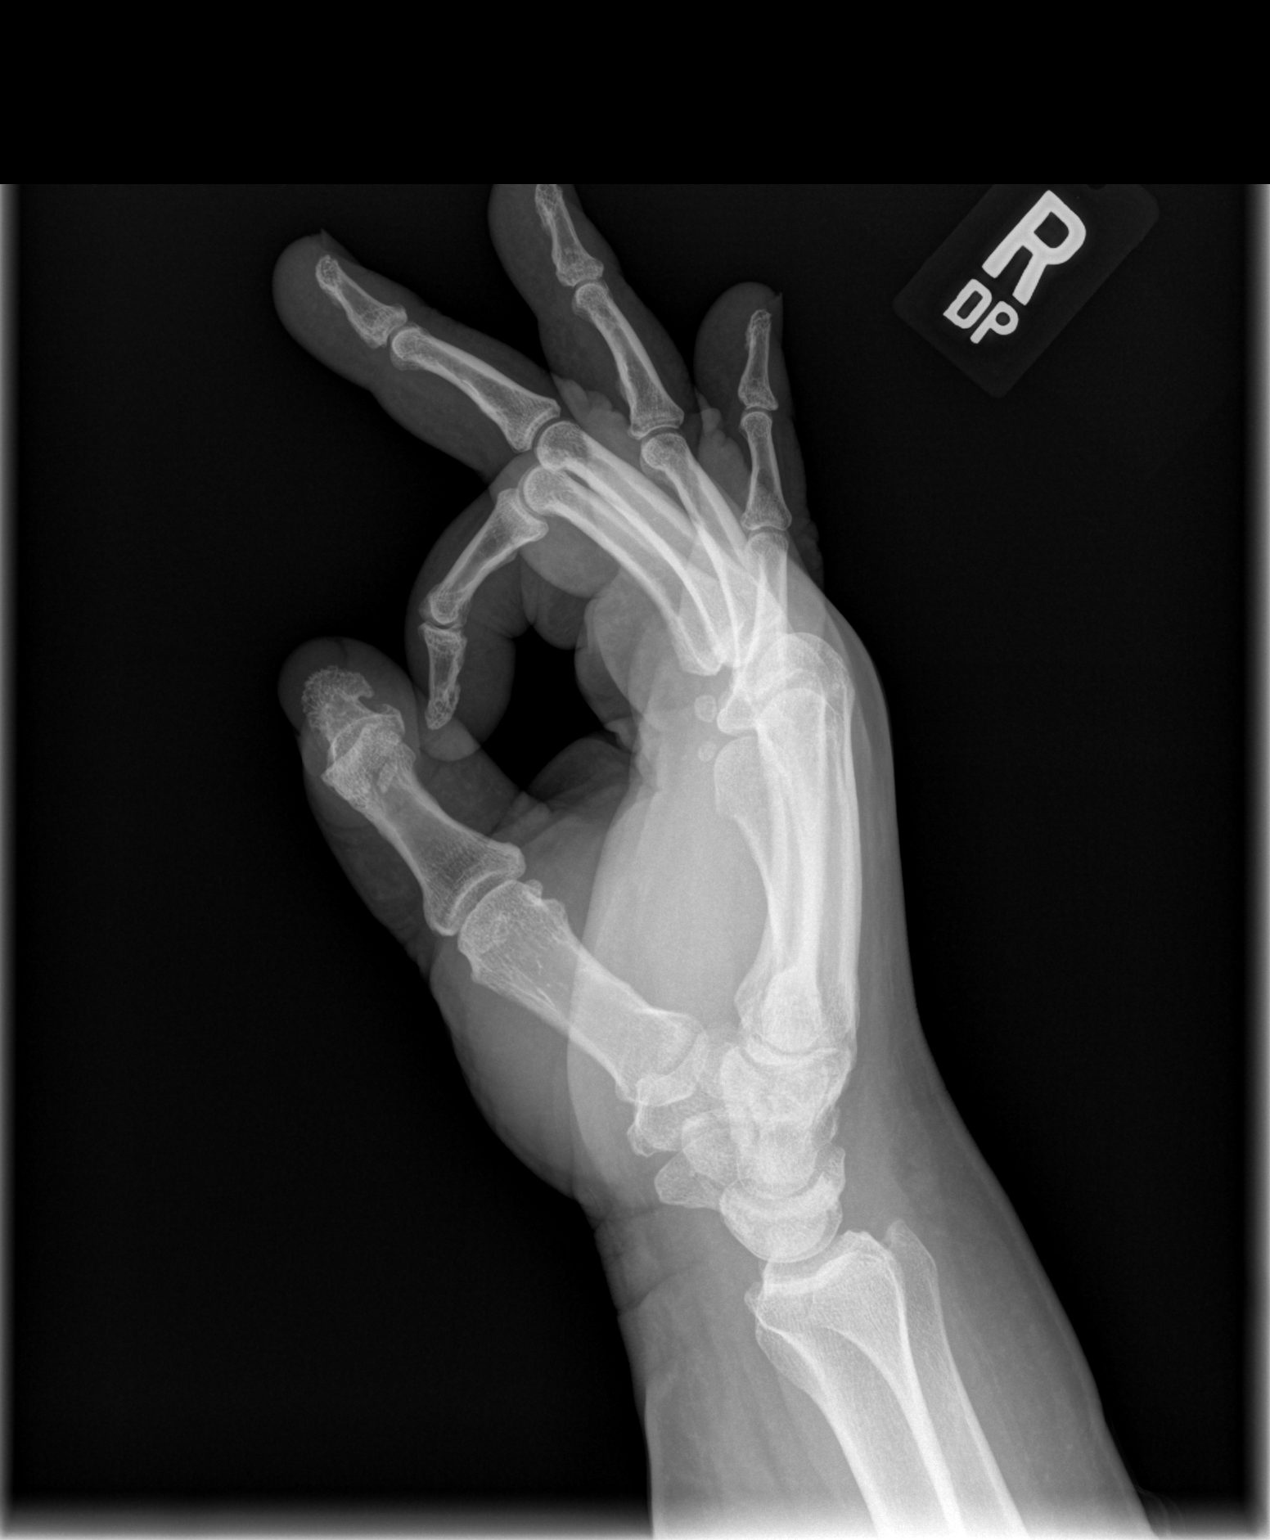

[3 of 3 positions shown; findings below may reference images not displayed]

FINDINGS: Frontal, oblique, and lateral views are obtained. No acute fracture,
subluxation, or dislocation. Joint spaces are well preserved. Bones
are normally mineralized. No erosive changes. Soft tissues are
unremarkable.
IMPRESSION: 1. Unremarkable right hand.
